# Patient Record
Sex: Female | Born: 1970
Health system: Southern US, Community
[De-identification: ages and names within clinical notes are randomized; demographics above are authoritative.]

## PROBLEM LIST (undated history)

## (undated) DIAGNOSIS — Z9889 Other specified postprocedural states: Secondary | ICD-10-CM

## (undated) DIAGNOSIS — C50411 Malignant neoplasm of upper-outer quadrant of right female breast: Principal | ICD-10-CM

## (undated) DIAGNOSIS — Z923 Personal history of irradiation: Secondary | ICD-10-CM

## (undated) DIAGNOSIS — IMO0002 Reserved for concepts with insufficient information to code with codable children: Secondary | ICD-10-CM

## (undated) DIAGNOSIS — C50919 Malignant neoplasm of unspecified site of unspecified female breast: Secondary | ICD-10-CM

## (undated) DIAGNOSIS — M1990 Unspecified osteoarthritis, unspecified site: Secondary | ICD-10-CM

## (undated) HISTORY — PX: DILATION AND CURETTAGE OF UTERUS: SHX78

## (undated) HISTORY — DX: Malignant neoplasm of upper-outer quadrant of right female breast: C50.411

## (undated) HISTORY — PX: DIAGNOSTIC LAPAROSCOPY: SUR761

## (undated) HISTORY — PX: WISDOM TOOTH EXTRACTION: SHX21

## (undated) HISTORY — DX: Other specified postprocedural states: Z98.890

---

## 2009-09-11 ENCOUNTER — Encounter: Admission: RE | Admit: 2009-09-11 | Discharge: 2009-09-11 | Payer: Self-pay | Admitting: Obstetrics and Gynecology

## 2009-09-15 ENCOUNTER — Encounter: Admission: RE | Admit: 2009-09-15 | Discharge: 2009-09-15 | Payer: Self-pay | Admitting: Obstetrics and Gynecology

## 2010-03-25 ENCOUNTER — Encounter: Payer: Self-pay | Admitting: Obstetrics and Gynecology

## 2010-08-22 ENCOUNTER — Other Ambulatory Visit: Payer: Self-pay | Admitting: Obstetrics and Gynecology

## 2010-08-22 DIAGNOSIS — Z1231 Encounter for screening mammogram for malignant neoplasm of breast: Secondary | ICD-10-CM

## 2010-09-13 ENCOUNTER — Ambulatory Visit
Admission: RE | Admit: 2010-09-13 | Discharge: 2010-09-13 | Disposition: A | Discharge status: Home / Self Care | Payer: BC Managed Care – PPO | Source: Ambulatory Visit | Attending: Obstetrics and Gynecology | Admitting: Obstetrics and Gynecology

## 2010-09-13 DIAGNOSIS — Z1231 Encounter for screening mammogram for malignant neoplasm of breast: Secondary | ICD-10-CM

## 2010-10-24 NOTE — H&P (Signed)
[name], [name]            ACCOUNT NO.:  [id number]  MEDICAL RECORD NO.:  [id number]  LOCATION:  [facility]                         FACILITY:  [facility]  PHYSICIAN:  Lenoard Aden, M.D.DATE OF BIRTH:  1971/02/13  DATE OF ADMISSION:  10/12/2010 DATE OF DISCHARGE:                             HISTORY & PHYSICAL   CHIEF COMPLAINT:  Dysfunctional uterine bleeding with structural lesion.  HISTORY OF PRESENT ILLNESS:  Brooke Ferguson is a 40 year old female G0, P0 with irregular bleeding and sonohysterogram.  The patient has had multiple endometrial polyps for surgical treatment.  She takes no medications.  There is no known drug allergy.  There is family history of lung cancer, MI, and hypertension.  She has __________pregnancy history.  PAST SURGICAL HISTORY:  Remarkable for ruptured disk with laparoscopic removal in 1992.  PHYSICAL EXAMINATION:  GENERAL:  Well-developed, well-nourished white female in no acute distress. HEENT:  Normal. NECK:  Supple.  Full range of motion. LUNGS:  Clear. HEART:  Regular rate and rhythm. ABDOMEN:  Soft and nontender. Small sized uterus and no adnexal masses. EXTREMITIES.  IMPRESSION:  Dysfunctional uterine bleeding with structural lesion for surgical therapy.  PLAN:  Proceed with diagnostic __________ resection.  Risks of anesthesia, infection, bleeding __________bowel and bladder noted. Inability to cure all dysfunctional bleeding discussed.  Patient acknowledged and wishes to proceed.     Lenoard Aden, M.D.     RJT/MEDQ  D:  10/24/2010  T:  10/24/2010  Job:  [id number]

## 2010-10-25 ENCOUNTER — Ambulatory Visit (HOSPITAL_COMMUNITY): Payer: BC Managed Care – PPO | Admitting: Anesthesiology

## 2010-10-25 ENCOUNTER — Encounter (HOSPITAL_COMMUNITY): Payer: Self-pay | Admitting: Anesthesiology

## 2010-10-25 ENCOUNTER — Ambulatory Visit (HOSPITAL_COMMUNITY)
Admission: RE | Admit: 2010-10-25 | Discharge: 2010-10-25 | Disposition: A | Discharge status: Home / Self Care | Payer: BC Managed Care – PPO | Source: Ambulatory Visit | Attending: Obstetrics and Gynecology | Admitting: Obstetrics and Gynecology

## 2010-10-25 ENCOUNTER — Other Ambulatory Visit: Payer: Self-pay | Admitting: Obstetrics and Gynecology

## 2010-10-25 ENCOUNTER — Encounter (HOSPITAL_COMMUNITY)
Admission: RE | Disposition: A | Discharge status: Home / Self Care | Payer: Self-pay | Source: Ambulatory Visit | Attending: Obstetrics and Gynecology

## 2010-10-25 DIAGNOSIS — N840 Polyp of corpus uteri: Secondary | ICD-10-CM | POA: Insufficient documentation

## 2010-10-25 DIAGNOSIS — N938 Other specified abnormal uterine and vaginal bleeding: Secondary | ICD-10-CM | POA: Insufficient documentation

## 2010-10-25 DIAGNOSIS — N949 Unspecified condition associated with female genital organs and menstrual cycle: Secondary | ICD-10-CM | POA: Insufficient documentation

## 2010-10-25 HISTORY — PX: HYSTEROSCOPY WITH RESECTOSCOPE: SHX5395

## 2010-10-25 LAB — CBC: MCHC: 34.1 g/dL (ref 30.0–36.0)

## 2010-10-25 SURGERY — HYSTEROSCOPY, USING RESECTOSCOPE
Anesthesia: Choice | Site: Uterus | Wound class: Clean Contaminated

## 2010-10-25 MED ORDER — LACTATED RINGERS IV SOLN
INTRAVENOUS | Status: DC
  Administered 2010-10-25 (×2): via INTRAVENOUS

## 2010-10-25 MED ORDER — KETOROLAC TROMETHAMINE 30 MG/ML IJ SOLN
INTRAMUSCULAR | Status: DC | PRN
  Administered 2010-10-25: 30 mg via INTRAVENOUS

## 2010-10-25 MED ORDER — FENTANYL CITRATE 0.05 MG/ML IJ SOLN
25.0000 ug | INTRAMUSCULAR | Status: DC | PRN
  Administered 2010-10-25 (×2): 50 ug via INTRAVENOUS

## 2010-10-25 MED ORDER — PROPOFOL 10 MG/ML IV EMUL
INTRAVENOUS | Status: DC | PRN
  Administered 2010-10-25: 170 mg via INTRAVENOUS

## 2010-10-25 MED ORDER — MIDAZOLAM HCL 5 MG/5ML IJ SOLN
INTRAMUSCULAR | Status: DC | PRN
  Administered 2010-10-25: 2 mg via INTRAVENOUS

## 2010-10-25 MED ORDER — ONDANSETRON HCL 4 MG/2ML IJ SOLN
INTRAMUSCULAR | Status: DC | PRN
  Administered 2010-10-25: 4 mg via INTRAVENOUS

## 2010-10-25 MED ORDER — FENTANYL CITRATE 0.05 MG/ML IJ SOLN
INTRAMUSCULAR | Status: AC
  Administered 2010-10-25: 50 ug via INTRAVENOUS
  Filled 2010-10-25: qty 2

## 2010-10-25 MED ORDER — LIDOCAINE HCL (CARDIAC) 20 MG/ML IV SOLN
INTRAVENOUS | Status: DC | PRN
  Administered 2010-10-25 (×2): 30 mg via INTRAVENOUS

## 2010-10-25 MED ORDER — FENTANYL CITRATE 0.05 MG/ML IJ SOLN
INTRAMUSCULAR | Status: AC
  Filled 2010-10-25: qty 2

## 2010-10-25 MED ORDER — ONDANSETRON HCL 4 MG/2ML IJ SOLN
INTRAMUSCULAR | Status: AC
  Filled 2010-10-25: qty 2

## 2010-10-25 MED ORDER — GLYCINE 1.5 % IR SOLN
Status: DC | PRN
  Administered 2010-10-25: 3000 mL

## 2010-10-25 MED ORDER — BUPIVACAINE HCL 0.25 % IJ SOLN
INTRAMUSCULAR | Status: DC | PRN
  Administered 2010-10-25: 20 mL

## 2010-10-25 MED ORDER — LIDOCAINE HCL (CARDIAC) 20 MG/ML IV SOLN
INTRAVENOUS | Status: AC
  Filled 2010-10-25: qty 5

## 2010-10-25 MED ORDER — VASOPRESSIN 20 UNIT/ML IJ SOLN
INTRAMUSCULAR | Status: DC | PRN
  Administered 2010-10-25: 20 [IU] via SUBCUTANEOUS

## 2010-10-25 MED ORDER — PROMETHAZINE HCL 25 MG RE SUPP
RECTAL | Status: AC
  Administered 2010-10-25: 25 mg
  Filled 2010-10-25: qty 1

## 2010-10-25 MED ORDER — MIDAZOLAM HCL 2 MG/2ML IJ SOLN
INTRAMUSCULAR | Status: AC
  Filled 2010-10-25: qty 2

## 2010-10-25 MED ORDER — FENTANYL CITRATE 0.05 MG/ML IJ SOLN
INTRAMUSCULAR | Status: DC | PRN
  Administered 2010-10-25 (×2): 50 ug via INTRAVENOUS

## 2010-10-25 MED ORDER — TRAMADOL HCL 50 MG PO TABS: 50.0000 mg | ORAL_TABLET | Freq: Four times a day (QID) | ORAL | Status: AC | PRN

## 2010-10-25 MED ORDER — PROPOFOL 10 MG/ML IV EMUL
INTRAVENOUS | Status: AC
  Filled 2010-10-25: qty 20

## 2010-10-25 SURGICAL SUPPLY — 18 items
ABLATOR ENDOMETRIAL BIPOLAR (ABLATOR) IMPLANT
CANISTER SUCTION 2500CC (MISCELLANEOUS) ×2 IMPLANT
CATH ROBINSON RED A/P 16FR (CATHETERS) ×2 IMPLANT
CLOTH BEACON ORANGE TIMEOUT ST (SAFETY) ×2 IMPLANT
CONTAINER PREFILL 10% NBF 60ML (FORM) ×4 IMPLANT
DRAPE UTILITY XL STRL (DRAPES) IMPLANT
ELECT REM PT RETURN 9FT ADLT (ELECTROSURGICAL) ×2
ELECTRODE REM PT RTRN 9FT ADLT (ELECTROSURGICAL) ×1 IMPLANT
ELECTRODE ROLLER BARREL 22FR (ELECTROSURGICAL) IMPLANT
ELECTRODE VAPORCUT 22FR (ELECTROSURGICAL) IMPLANT
GLOVE BIO SURGEON STRL SZ7.5 (GLOVE) ×4 IMPLANT
GOWN PREVENTION PLUS LG XLONG (DISPOSABLE) ×2 IMPLANT
GOWN PREVENTION PLUS XLARGE (GOWN DISPOSABLE) ×2 IMPLANT
LOOP ANGLED CUTTING 22FR (CUTTING LOOP) ×2 IMPLANT
PACK HYSTEROSCOPY LF (CUSTOM PROCEDURE TRAY) ×2 IMPLANT
SYR TB 1ML 25GX5/8 (SYRINGE) ×2 IMPLANT
TOWEL OR 17X24 6PK STRL BLUE (TOWEL DISPOSABLE) ×4 IMPLANT
WATER STERILE IRR 1000ML POUR (IV SOLUTION) ×2 IMPLANT

## 2010-10-25 NOTE — Transfer of Care (Signed)
  Anesthesia Post-op Note  Patient: Brooke Ferguson  Procedure(s) Performed:  HYSTEROSCOPY WITH RESECTOSCOPE - Polypectomy  Patient Location: PACU  Anesthesia Type: General  Level of Consciousness: awake, alert , oriented and patient cooperative  Airway and Oxygen Therapy: Patient Spontanous Breathing and Patient connected to nasal cannula oxygen  Post-op Pain: moderate  Post-op Assessment: Post-op Vital signs reviewed, Patient's Cardiovascular Status Stable and Respiratory Function Stable  Post-op Vital Signs: Reviewed and stable  Complications: No apparent anesthesia complications

## 2010-10-25 NOTE — Anesthesia Postprocedure Evaluation (Signed)
Anesthesia Post Note  Patient: Brooke Ferguson  Procedure(s) Performed:  HYSTEROSCOPY WITH RESECTOSCOPE - Polypectomy  Anesthesia type: GA  Patient location: PACU  Post pain: Pain level controlled  Post assessment: Post-op Vital signs reviewed  Last Vitals:  Filed Vitals:   10/25/10 1315  BP: 110/77  Pulse: 53  Temp:   Resp: 16    Post vital signs: Reviewed  Level of consciousness: sedated  Complications: No apparent anesthesia complications

## 2010-10-25 NOTE — Op Note (Signed)
NAMEROSEANA, RHINE            ACCOUNT NO.:  [id number]  MEDICAL RECORD NO.:  [id number]  LOCATION:  [facility]                          FACILITY:  [facility]  PHYSICIAN:  Lenoard Aden, M.D.DATE OF BIRTH:  May 08, 1970  DATE OF PROCEDURE:  10/25/2010 DATE OF DISCHARGE:  10/25/2010                              OPERATIVE REPORT   After being apprised of the risks of anesthesia, infection, bleeding, possible uterine perforation with injury to abdominal organs, bowel, bladder, and possible need for repair, the patient consented and signed. She was brought to the operating room where she was administered general anesthetic without complications, prepped and draped in the usual sterile fashion.  Catheterized until the bladder was empty.  Exam under anesthesia revealed an anteflexed uterus and no adnexal masses.  A dilute Pitressin solution was placed at 3 and 9 o'clock at the cervicovaginal junction 16 mL total, dilute paracervical block using 20 mL of the dilute Marcaine solution was placed without difficulty. Anterior lip of the cervix was grasped using single tooth tenaculum, and the cervix was dilated easily up to #29 [facility] dilator.  Hysteroscope was placed.  Visualization reveals a large endometrial polyp in the anterior and posterior wall.  These were resected in total using a right-angle loop without difficulty.  Bilateral normal tubal ostia were noted. Polyp forceps were placed to remove the remaining specimen.  D and C was performed in a 4-quadrant method using blunt curettage revealing no further evidence of polyps and an otherwise normal endometrial cavity. No fibroids were noted.  Normal ostia as previously mentioned are noted. At this time, the procedure was terminated.  Fluid deficit of 125 mL noted.  Estimated blood loss of less than 50 mL.  The patient was awakened and transferred to recovery in good condition.     Lenoard Aden, M.D.     RJT/MEDQ  D:  10/25/2010  T:   10/25/2010  Job:  [id number]

## 2010-10-25 NOTE — Progress Notes (Signed)
  No changes noted. History and physical done.

## 2010-10-25 NOTE — Anesthesia Procedure Notes (Addendum)
Performed by: Suella Grove   Procedure Name: LMA Insertion Date/Time: 10/25/2010 12:04 PM Performed by: Suella Grove Pre-anesthesia Checklist: Patient identified, Patient being monitored, Timeout performed, Emergency Drugs available and Suction available Patient Re-evaluated:Patient Re-evaluated prior to inductionOxygen Delivery Method: Circle System Utilized Preoxygenation: Pre-oxygenation with 100% oxygen Intubation Type: IV induction Ventilation: Mask ventilation without difficulty LMA: LMA inserted LMA Size: 4.0 Grade View: Grade II Number of attempts: 1 Dental Injury: Teeth and Oropharynx as per pre-operative assessment

## 2010-10-25 NOTE — Op Note (Signed)
10/25/2010  12:38 PM  PATIENT:  Brooke Ferguson  40 y.o. female  PRE-OPERATIVE DIAGNOSIS:  Menomenorrhagia  POST-OPERATIVE DIAGNOSIS:Same plus multiple endometrial polyps  PROCEDURE:  Procedure(s): HYSTEROSCOPY WITH RESECTOSCOPIC POLYPECTOMY and D&C  SURGEON:  Surgeon(s): Lenoard Aden, MD  PHYSICIAN ASSISTANT:   ASSISTANTS: none   ANESTHESIA:   local and general  ESTIMATED BLOOD LOSS: * No blood loss amount entered *   BLOOD ADMINISTERED:none  DRAINS: none   LOCAL MEDICATIONS USED:  MARCAINE 20CC and OTHER Pitressin 16cc  SPECIMEN:  Source of Specimen:  EMC and endometrial polyps  DISPOSITION OF SPECIMEN:  PATHOLOGY  COUNTS:  YES  TOURNIQUET:  * No tourniquets in log *  DICTATION #: [id number]  PLAN OF CARE: DC home  PATIENT DISPOSITION:  PACU - hemodynamically stable.

## 2010-10-25 NOTE — Anesthesia Preprocedure Evaluation (Addendum)
Anesthesia Evaluation  Name, MR# and DOB Patient awake  General Assessment Comment  Reviewed: Allergy & Precautions, H&P , NPO status , Patient's Chart, lab work & pertinent test results, reviewed documented beta blocker date and time   Airway Mallampati: I TM Distance: >3 FB Neck ROM: full    Dental  (+) Teeth Intact   Pulmonary  clear to auscultation  breath sounds clear to auscultation none    Cardiovascular Exercise Tolerance: Good regular Normal    Neuro/Psych Negative Neurological ROS  Negative Psych ROS  GI/Hepatic/Renal negative GI ROS  negative Liver ROS  negative Renal ROS        Endo/Other  Negative Endocrine ROS (+)      Abdominal   Musculoskeletal negative musculoskeletal ROS (+)   Hematology negative hematology ROS (+)   Peds  Reproductive/Obstetrics negative OB ROS    Anesthesia Other Findings            Anesthesia Physical Anesthesia Plan  ASA: I  Anesthesia Plan: General   Post-op Pain Management:    Induction:   Airway Management Planned: LMA  Additional Equipment:   Intra-op Plan:   Post-operative Plan:   Informed Consent: I have reviewed the patients History and Physical, chart, labs and discussed the procedure including the risks, benefits and alternatives for the proposed anesthesia with the patient or authorized representative who has indicated his/her understanding and acceptance.   Dental Advisory Given  Plan Discussed with: Surgeon and CRNA  Anesthesia Plan Comments:        Anesthesia Quick Evaluation

## 2010-11-13 ENCOUNTER — Encounter (HOSPITAL_COMMUNITY): Payer: Self-pay | Admitting: Obstetrics and Gynecology

## 2011-03-18 ENCOUNTER — Encounter (HOSPITAL_BASED_OUTPATIENT_CLINIC_OR_DEPARTMENT_OTHER): Payer: Self-pay | Admitting: *Deleted

## 2011-03-18 ENCOUNTER — Emergency Department (HOSPITAL_BASED_OUTPATIENT_CLINIC_OR_DEPARTMENT_OTHER)
Admission: EM | Admit: 2011-03-18 | Discharge: 2011-03-18 | Disposition: A | Discharge status: Home / Self Care | Payer: BC Managed Care – PPO | Attending: Emergency Medicine | Admitting: Emergency Medicine

## 2011-03-18 ENCOUNTER — Emergency Department (INDEPENDENT_AMBULATORY_CARE_PROVIDER_SITE_OTHER): Payer: BC Managed Care – PPO

## 2011-03-18 DIAGNOSIS — R079 Chest pain, unspecified: Secondary | ICD-10-CM

## 2011-03-18 DIAGNOSIS — M542 Cervicalgia: Secondary | ICD-10-CM | POA: Insufficient documentation

## 2011-03-18 DIAGNOSIS — M79609 Pain in unspecified limb: Secondary | ICD-10-CM

## 2011-03-18 DIAGNOSIS — M25519 Pain in unspecified shoulder: Secondary | ICD-10-CM

## 2011-03-18 DIAGNOSIS — M5412 Radiculopathy, cervical region: Secondary | ICD-10-CM | POA: Insufficient documentation

## 2011-03-18 HISTORY — DX: Reserved for concepts with insufficient information to code with codable children: IMO0002

## 2011-03-18 LAB — COMPREHENSIVE METABOLIC PANEL
CO2: 25 mEq/L (ref 19–32)
Calcium: 9.2 mg/dL (ref 8.4–10.5)
Creatinine, Ser: 0.7 mg/dL (ref 0.50–1.10)
GFR calc Af Amer: >90 mL/min (ref >90)
GFR calc non Af Amer: >90 mL/min (ref >90)
Glucose, Bld: 91 mg/dL (ref 70–99)
Sodium: 141 mEq/L (ref 135–145)
Total Protein: 6.5 g/dL (ref 6.0–8.3)

## 2011-03-18 LAB — URINALYSIS, ROUTINE W REFLEX MICROSCOPIC
Leukocytes, UA: NEGATIVE
Nitrite: NEGATIVE
Protein, ur: NEGATIVE mg/dL
Specific Gravity, Urine: 1.026 (ref 1.005–1.030)
Urobilinogen, UA: 0.2 mg/dL (ref 0.0–1.0)

## 2011-03-18 LAB — DIFFERENTIAL
Eosinophils Absolute: 0.1 10*3/uL (ref 0.0–0.7)
Eosinophils Relative: 1 % (ref 0–5)
Lymphocytes Relative: 37 % (ref 12–46)
Lymphs Abs: 2 10*3/uL (ref 0.7–4.0)
Monocytes Absolute: 0.4 10*3/uL (ref 0.1–1.0)

## 2011-03-18 LAB — CBC
HCT: 38.3 % (ref 36.0–46.0)
MCH: 32.7 pg (ref 26.0–34.0)
MCV: 94.8 fL (ref 78.0–100.0)
Platelets: 326 10*3/uL (ref 150–400)
RDW: 11.6 % (ref 11.5–15.5)
WBC: 5.3 10*3/uL (ref 4.0–10.5)

## 2011-03-18 LAB — D-DIMER, QUANTITATIVE: D-Dimer, Quant: 0.35 ug/mL-FEU (ref 0.00–0.48)

## 2011-03-18 MED ORDER — PREDNISONE 10 MG PO TABS: 20.0000 mg | ORAL_TABLET | Freq: Every day | ORAL | Status: DC

## 2011-03-18 MED ORDER — HYDROMORPHONE HCL PF 2 MG/ML IJ SOLN
2.0000 mg | Freq: Once | INTRAMUSCULAR | Status: AC
  Administered 2011-03-18: 2 mg via INTRAVENOUS
  Filled 2011-03-18: qty 1

## 2011-03-18 MED ORDER — OXYCODONE-ACETAMINOPHEN 5-325 MG PO TABS: 1.0000 | ORAL_TABLET | ORAL | Status: DC | PRN

## 2011-03-18 MED ORDER — PREDNISONE 50 MG PO TABS
60.0000 mg | ORAL_TABLET | Freq: Once | ORAL | Status: AC
  Administered 2011-03-18: 60 mg via ORAL
  Filled 2011-03-18: qty 1

## 2011-03-18 NOTE — ED Provider Notes (Signed)
History     CSN: [id number]  Arrival date & time 03/18/11  6213   First MD Initiated Contact with Patient 03/18/11 1025      Chief Complaint  Patient presents with  . Neck Pain    (Consider location/radiation/quality/duration/timing/severity/associated sxs/prior treatment) HPI Patient with mri for disc at c5c6 s/p cortisone injection neurosurgeon Dr. Jeral Fruit.  Pain was improved until 6 p.m. Yesterday when left neck felt tight to shoulder c.w. Prior pain and radiates to left arm.  Arm feels numb, weakness in left arm at baseline per patient.  Patient is right handed.  Patient saw chiropractor this a.m. And told it was too inflamed and to follow up with her neurosurgeon.  Neurosurgery unable to see and sent here for follow up.   Past Medical History  Diagnosis Date  . Bulging disc     Past Surgical History  Procedure Date  . Hysteroscopy with resectoscope 10/25/2010    Procedure: HYSTEROSCOPY WITH RESECTOSCOPE;  Surgeon: Lenoard Aden, MD;  Location: [facility];  Service: Gynecology;  Laterality: N/A;  Polypectomy    History reviewed. No pertinent family history.  History  Substance Use Topics  . Smoking status: Never Smoker   . Smokeless tobacco: Never Used  . Alcohol Use: Yes     occ    OB History    Grav Para Term Preterm Abortions TAB SAB Ect Mult Living                  Review of Systems  All other systems reviewed and are negative.    Allergies  Vicodin  Home Medications  No current outpatient prescriptions on file.  BP 132/82  Pulse 74  Temp(Src) 98.4 F (36.9 C) (Oral)  Resp 20  SpO2 99%  LMP 03/10/2011  Physical Exam  Nursing note and vitals reviewed. Constitutional: She is oriented to person, place, and time. She appears well-developed and well-nourished.  HENT:  Head: Normocephalic and atraumatic.  Eyes: Conjunctivae and EOM are normal. Pupils are equal, round, and reactive to light.  Neck: Normal range of motion. Neck supple.    Cardiovascular: Normal rate, regular rhythm and normal heart sounds.   Pulmonary/Chest: Effort normal and breath sounds normal.  Abdominal: Soft. Bowel sounds are normal.  Musculoskeletal: Normal range of motion.  Neurological: She is alert and oriented to person, place, and time. She has normal strength and normal reflexes. No sensory deficit.  Skin: Skin is warm and dry.  Psychiatric: She has a normal mood and affect.    ED Course  Procedures (including critical care time)  Labs Reviewed - No data to display No results found. Results for orders placed during the hospital encounter of 03/18/11  CBC      Component Value Range   WBC 5.3  4.0 - 10.5 (K/uL)   RBC 4.04  3.87 - 5.11 (MIL/uL)   Hemoglobin 13.2  12.0 - 15.0 (g/dL)   HCT 08.6  57.8 - 46.9 (%)   MCV 94.8  78.0 - 100.0 (fL)   MCH 32.7  26.0 - 34.0 (pg)   MCHC 34.5  30.0 - 36.0 (g/dL)   RDW 62.9  52.8 - 41.3 (%)   Platelets 326  150 - 400 (K/uL)  DIFFERENTIAL      Component Value Range   Neutrophils Relative 54  43 - 77 (%)   Neutro Abs 2.9  1.7 - 7.7 (K/uL)   Lymphocytes Relative 37  12 - 46 (%)   Lymphs Abs 2.0  0.7 - 4.0 (K/uL)   Monocytes Relative 8  3 - 12 (%)   Monocytes Absolute 0.4  0.1 - 1.0 (K/uL)   Eosinophils Relative 1  0 - 5 (%)   Eosinophils Absolute 0.1  0.0 - 0.7 (K/uL)   Basophils Relative 0  0 - 1 (%)   Basophils Absolute 0.0  0.0 - 0.1 (K/uL)  COMPREHENSIVE METABOLIC PANEL      Component Value Range   Sodium 141  135 - 145 (mEq/L)   Potassium 3.7  3.5 - 5.1 (mEq/L)   Chloride 107  96 - 112 (mEq/L)   CO2 25  19 - 32 (mEq/L)   Glucose, Bld 91  70 - 99 (mg/dL)   BUN 14  6 - 23 (mg/dL)   Creatinine, Ser 1.61  0.50 - 1.10 (mg/dL)   Calcium 9.2  8.4 - 09.6 (mg/dL)   Total Protein 6.5  6.0 - 8.3 (g/dL)   Albumin 3.5  3.5 - 5.2 (g/dL)   AST 13  0 - 37 (U/L)   ALT 11  0 - 35 (U/L)   Alkaline Phosphatase 42  39 - 117 (U/L)   Total Bilirubin 0.3  0.3 - 1.2 (mg/dL)   GFR calc non Af Amer >90  >90  (mL/min)   GFR calc Af Amer >90  >90 (mL/min)  D-DIMER, QUANTITATIVE      Component Value Range   D-Dimer, Quant 0.35  0.00 - 0.48 (ug/mL-FEU)   Dg Chest 2 View  03/18/2011  *RADIOLOGY REPORT*  Clinical Data: Left lateral neck, shoulder and arm pain, left upper anterior chest pain  CHEST - 2 VIEW  Comparison: None  Findings: Normal heart size, mediastinal contours, and pulmonary vascularity. Minimal peribronchial thickening without infiltrate or effusion. No pneumothorax. No acute osseous findings.  IMPRESSION: Minimal bronchitic changes.  Original Report Authenticated By: Lollie Marrow, M.D.    No diagnosis found.    MDM  Patient with decreased pain after prednisone and 2 mg of Dilaudid IM. No evidence of pneumothorax or infiltrate on chest x-Afnan [place]. D-dimer is normal. The pain appears to come from the neck without any acute neurological changes. She will be placed on prednisone and Percocet. She'll follow with Dr. care.      Hilario Quarry, MD 03/18/11 [phone]

## 2011-03-18 NOTE — ED Notes (Signed)
Pt states she has known bulging disc has had 2 cortisone injections in neck for same issue in recent past at [facility]. Pt went to her chiropractor who states left side of neck and muscles going to left shoulder were too inflamed for him to touch.She was told to see her neurologist/neurosurgeon. She went there was told wouldn't see her so she came here.

## 2011-03-19 ENCOUNTER — Encounter (HOSPITAL_COMMUNITY): Payer: Self-pay | Admitting: *Deleted

## 2011-03-26 ENCOUNTER — Encounter (HOSPITAL_COMMUNITY): Payer: Self-pay | Admitting: Pharmacist

## 2011-04-02 ENCOUNTER — Other Ambulatory Visit: Payer: Self-pay | Admitting: Obstetrics and Gynecology

## 2011-04-10 ENCOUNTER — Encounter (HOSPITAL_COMMUNITY)
Admission: RE | Disposition: A | Discharge status: Home / Self Care | Payer: Self-pay | Source: Ambulatory Visit | Attending: Obstetrics and Gynecology

## 2011-04-10 ENCOUNTER — Encounter (HOSPITAL_COMMUNITY): Payer: Self-pay | Admitting: Anesthesiology

## 2011-04-10 ENCOUNTER — Ambulatory Visit (HOSPITAL_COMMUNITY)
Admission: RE | Admit: 2011-04-10 | Discharge: 2011-04-10 | Disposition: A | Discharge status: Home / Self Care | Payer: BC Managed Care – PPO | Source: Ambulatory Visit | Attending: Obstetrics and Gynecology | Admitting: Obstetrics and Gynecology

## 2011-04-10 ENCOUNTER — Other Ambulatory Visit: Payer: Self-pay | Admitting: Obstetrics and Gynecology

## 2011-04-10 ENCOUNTER — Encounter (HOSPITAL_COMMUNITY): Payer: Self-pay | Admitting: *Deleted

## 2011-04-10 ENCOUNTER — Ambulatory Visit (HOSPITAL_COMMUNITY): Payer: BC Managed Care – PPO | Admitting: Anesthesiology

## 2011-04-10 DIAGNOSIS — N920 Excessive and frequent menstruation with regular cycle: Secondary | ICD-10-CM | POA: Insufficient documentation

## 2011-04-10 HISTORY — DX: Unspecified osteoarthritis, unspecified site: M19.90

## 2011-04-10 LAB — HCG, SERUM, QUALITATIVE: Preg, Serum: NEGATIVE

## 2011-04-10 LAB — CBC
MCV: 95.2 fL (ref 78.0–100.0)
Platelets: 318 10*3/uL (ref 150–400)
RBC: 4.33 MIL/uL (ref 3.87–5.11)
RDW: 12 % (ref 11.5–15.5)
WBC: 4.3 10*3/uL (ref 4.0–10.5)

## 2011-04-10 SURGERY — DILATATION & CURETTAGE/HYSTEROSCOPY WITH THERMACHOICE ABLATION
Anesthesia: General | Site: Uterus | Wound class: Clean Contaminated

## 2011-04-10 MED ORDER — OXYCODONE-ACETAMINOPHEN 5-325 MG PO TABS: 1.0000 | ORAL_TABLET | ORAL | Status: AC | PRN

## 2011-04-10 MED ORDER — PROMETHAZINE HCL 25 MG/ML IJ SOLN: 6.2500 mg | INTRAMUSCULAR | Status: DC | PRN

## 2011-04-10 MED ORDER — MIDAZOLAM HCL 2 MG/2ML IJ SOLN
INTRAMUSCULAR | Status: AC
  Filled 2011-04-10: qty 2

## 2011-04-10 MED ORDER — OXYCODONE-ACETAMINOPHEN 5-325 MG PO TABS
1.0000 | ORAL_TABLET | ORAL | Status: DC | PRN
  Administered 2011-04-10: 1 via ORAL

## 2011-04-10 MED ORDER — LACTATED RINGERS IV SOLN
INTRAVENOUS | Status: DC
  Administered 2011-04-10 (×3): via INTRAVENOUS

## 2011-04-10 MED ORDER — LIDOCAINE HCL (CARDIAC) 20 MG/ML IV SOLN
INTRAVENOUS | Status: DC | PRN
  Administered 2011-04-10: 80 mg via INTRAVENOUS

## 2011-04-10 MED ORDER — OXYCODONE-ACETAMINOPHEN 5-325 MG PO TABS
ORAL_TABLET | ORAL | Status: AC
  Administered 2011-04-10: 1 via ORAL
  Filled 2011-04-10: qty 1

## 2011-04-10 MED ORDER — DEXAMETHASONE SODIUM PHOSPHATE 4 MG/ML IJ SOLN
INTRAMUSCULAR | Status: DC | PRN
  Administered 2011-04-10: 10 mg via INTRAVENOUS

## 2011-04-10 MED ORDER — MEPERIDINE HCL 25 MG/ML IJ SOLN
6.2500 mg | INTRAMUSCULAR | Status: DC | PRN
  Administered 2011-04-10 (×2): 12.5 mg via INTRAVENOUS

## 2011-04-10 MED ORDER — MEPERIDINE HCL 25 MG/ML IJ SOLN
INTRAMUSCULAR | Status: AC
  Administered 2011-04-10: 12.5 mg via INTRAVENOUS
  Filled 2011-04-10: qty 1

## 2011-04-10 MED ORDER — CEFAZOLIN SODIUM 1-5 GM-% IV SOLN
INTRAVENOUS | Status: AC
  Administered 2011-04-10: 1 g via INTRAVENOUS
  Filled 2011-04-10: qty 50

## 2011-04-10 MED ORDER — LACTATED RINGERS IR SOLN
Status: DC | PRN
  Administered 2011-04-10: 1

## 2011-04-10 MED ORDER — BUPIVACAINE HCL (PF) 0.25 % IJ SOLN
INTRAMUSCULAR | Status: AC
  Filled 2011-04-10: qty 30

## 2011-04-10 MED ORDER — FENTANYL CITRATE 0.05 MG/ML IJ SOLN
INTRAMUSCULAR | Status: AC
  Administered 2011-04-10: 25 ug via INTRAVENOUS
  Filled 2011-04-10: qty 2

## 2011-04-10 MED ORDER — PROPOFOL 10 MG/ML IV EMUL
INTRAVENOUS | Status: DC | PRN
  Administered 2011-04-10: 150 mg via INTRAVENOUS

## 2011-04-10 MED ORDER — BUPIVACAINE HCL (PF) 0.25 % IJ SOLN
INTRAMUSCULAR | Status: DC | PRN
  Administered 2011-04-10: 20 mL

## 2011-04-10 MED ORDER — FENTANYL CITRATE 0.05 MG/ML IJ SOLN
INTRAMUSCULAR | Status: DC | PRN
  Administered 2011-04-10: 50 ug via INTRAVENOUS
  Administered 2011-04-10: 25 ug via INTRAVENOUS
  Administered 2011-04-10 (×2): 50 ug via INTRAVENOUS
  Administered 2011-04-10: 25 ug via INTRAVENOUS

## 2011-04-10 MED ORDER — FENTANYL CITRATE 0.05 MG/ML IJ SOLN
INTRAMUSCULAR | Status: AC
  Filled 2011-04-10: qty 4

## 2011-04-10 MED ORDER — DEXAMETHASONE SODIUM PHOSPHATE 10 MG/ML IJ SOLN
INTRAMUSCULAR | Status: AC
  Filled 2011-04-10: qty 1

## 2011-04-10 MED ORDER — KETOROLAC TROMETHAMINE 30 MG/ML IJ SOLN
INTRAMUSCULAR | Status: AC
  Administered 2011-04-10: 30 mg via INTRAVENOUS
  Filled 2011-04-10: qty 1

## 2011-04-10 MED ORDER — MIDAZOLAM HCL 5 MG/5ML IJ SOLN
INTRAMUSCULAR | Status: DC | PRN
  Administered 2011-04-10: 2 mg via INTRAVENOUS

## 2011-04-10 MED ORDER — FENTANYL CITRATE 0.05 MG/ML IJ SOLN
25.0000 ug | INTRAMUSCULAR | Status: DC | PRN
  Administered 2011-04-10: 25 ug via INTRAVENOUS

## 2011-04-10 MED ORDER — PROPOFOL 10 MG/ML IV EMUL
INTRAVENOUS | Status: AC
  Filled 2011-04-10: qty 20

## 2011-04-10 MED ORDER — KETOROLAC TROMETHAMINE 30 MG/ML IJ SOLN
INTRAMUSCULAR | Status: AC
  Filled 2011-04-10: qty 1

## 2011-04-10 MED ORDER — KETOROLAC TROMETHAMINE 30 MG/ML IJ SOLN
15.0000 mg | Freq: Once | INTRAMUSCULAR | Status: AC | PRN
  Administered 2011-04-10: 30 mg via INTRAVENOUS

## 2011-04-10 MED ORDER — VASOPRESSIN 20 UNIT/ML IJ SOLN
INTRAMUSCULAR | Status: AC
  Filled 2011-04-10: qty 1

## 2011-04-10 MED ORDER — ONDANSETRON HCL 4 MG/2ML IJ SOLN
INTRAMUSCULAR | Status: AC
  Filled 2011-04-10: qty 2

## 2011-04-10 MED ORDER — SCOPOLAMINE 1 MG/3DAYS TD PT72
1.0000 | MEDICATED_PATCH | TRANSDERMAL | Status: DC
  Administered 2011-04-10: 1.5 mg via TRANSDERMAL

## 2011-04-10 MED ORDER — SCOPOLAMINE 1 MG/3DAYS TD PT72
MEDICATED_PATCH | TRANSDERMAL | Status: AC
  Administered 2011-04-10: 1.5 mg via TRANSDERMAL
  Filled 2011-04-10: qty 1

## 2011-04-10 MED ORDER — CEFAZOLIN SODIUM 1-5 GM-% IV SOLN: 1.0000 g | INTRAVENOUS | Status: DC

## 2011-04-10 MED ORDER — LIDOCAINE HCL (CARDIAC) 20 MG/ML IV SOLN
INTRAVENOUS | Status: AC
  Filled 2011-04-10: qty 5

## 2011-04-10 MED ORDER — ONDANSETRON HCL 4 MG/2ML IJ SOLN
INTRAMUSCULAR | Status: DC | PRN
  Administered 2011-04-10: 4 mg via INTRAVENOUS

## 2011-04-10 MED ORDER — FENTANYL CITRATE 0.05 MG/ML IJ SOLN
INTRAMUSCULAR | Status: AC
  Filled 2011-04-10: qty 5

## 2011-04-10 MED ORDER — KETOROLAC TROMETHAMINE 30 MG/ML IJ SOLN
INTRAMUSCULAR | Status: DC | PRN
  Administered 2011-04-10: 60 mg via INTRAVENOUS

## 2011-04-10 MED ORDER — IBUPROFEN 200 MG PO TABS: 200.0000 mg | ORAL_TABLET | Freq: Four times a day (QID) | ORAL | Status: DC | PRN

## 2011-04-10 SURGICAL SUPPLY — 13 items
ABLATOR ENDOMETRIAL BIPOLAR (ABLATOR) ×3 IMPLANT
CATH ROBINSON RED A/P 16FR (CATHETERS) ×3 IMPLANT
CATH THERMACHOICE III (CATHETERS) ×3 IMPLANT
CLOTH BEACON ORANGE TIMEOUT ST (SAFETY) ×3 IMPLANT
CONTAINER PREFILL 10% NBF 60ML (FORM) ×6 IMPLANT
GLOVE BIO SURGEON STRL SZ7.5 (GLOVE) ×6 IMPLANT
GOWN PREVENTION PLUS LG XLONG (DISPOSABLE) ×3 IMPLANT
GOWN STRL REIN XL XLG (GOWN DISPOSABLE) ×3 IMPLANT
PACK HYSTEROSCOPY LF (CUSTOM PROCEDURE TRAY) ×3 IMPLANT
PAD PREP 24X48 CUFFED NSTRL (MISCELLANEOUS) ×3 IMPLANT
SYR TB 1ML 25GX5/8 (SYRINGE) ×3 IMPLANT
TOWEL OR 17X24 6PK STRL BLUE (TOWEL DISPOSABLE) ×6 IMPLANT
WATER STERILE IRR 1000ML POUR (IV SOLUTION) ×3 IMPLANT

## 2011-04-10 NOTE — Preoperative (Signed)
Beta Blockers   Reason not to administer Beta Blockers:Not Applicable 

## 2011-04-10 NOTE — H&P (Signed)
Brooke Ferguson, Brooke Ferguson            ACCOUNT NO.:  [id number]  MEDICAL RECORD NO.:  [id number]  LOCATION:  [facility]                         FACILITY:  [facility]  PHYSICIAN:  Lenoard Aden, M.D.DATE OF BIRTH:  [birth year]  DATE OF ADMISSION:  03/08/2011 DATE OF DISCHARGE:                             HISTORY & PHYSICAL   CHIEF COMPLAINT:  Refractorymenorrhagia.  HISTORY OF PRESENT ILLNESS:  A 41 year old white female G0, P0who is status post  diagnostic hysteroscopy, D and C, ruptured ovarian cyst, __________ in 1992.  She has no known drug allergies.  MEDICATIONS:  Advil as needed.  FAMILY HISTORY:  Heart disease, lung cancer, and hypertension.  __________  PHYSICAL EXAMINATION:  GENERAL:  She is a well-developed, well- nourished, white female, in no acute distress. HEENT:  Normal. NECK:  Supple.  Full range of motion. LUNGS:  Clear. HEART:  Regular rate and rhythm. ABDOMEN:  Soft and nontender. PELVIC:  Reveals an anteflexed uterus and no adnexal masses. EXTREMITIES:  There are no cords.  IMPRESSION:  Refractory menorrhagia.  PLAN:  Proceed with diagnostic hysteroscopy, D and C, NovaSure endometrial ablation.  Risks of anesthesia, infection, bleeding, injury to abdominal organswere discussed,  delayed versus immediate complications to include bowel and bladder injury noted.  The patient acknowledges and wishes to proceed.     Lenoard Aden, M.D.     RJT/MEDQ  D:  04/09/2011  T:  04/09/2011  Job:  [id number]

## 2011-04-10 NOTE — Anesthesia Preprocedure Evaluation (Signed)
Anesthesia Evaluation  Patient identified by MRN, date of birth, ID band Patient awake    Reviewed: Allergy & Precautions, H&P , NPO status , Patient's Chart, lab work & pertinent test results, reviewed documented beta blocker date and time   Airway Mallampati: I TM Distance: >3 FB Neck ROM: full    Dental No notable dental hx. (+) Teeth Intact   Pulmonary neg pulmonary ROS,    Pulmonary exam normal       Cardiovascular neg cardio ROS     Neuro/Psych Negative Neurological ROS  Negative Psych ROS   GI/Hepatic negative GI ROS, Neg liver ROS,   Endo/Other  Negative Endocrine ROS  Renal/GU negative Renal ROS  Genitourinary negative   Musculoskeletal negative musculoskeletal ROS (+)   Abdominal Normal abdominal exam  (+)   Peds negative pediatric ROS (+)  Hematology negative hematology ROS (+)   Anesthesia Other Findings   Reproductive/Obstetrics negative OB ROS                           Anesthesia Physical Anesthesia Plan  ASA: I  Anesthesia Plan: General   Post-op Pain Management:    Induction: Intravenous  Airway Management Planned: LMA  Additional Equipment:   Intra-op Plan:   Post-operative Plan:   Informed Consent: I have reviewed the patients History and Physical, chart, labs and discussed the procedure including the risks, benefits and alternatives for the proposed anesthesia with the patient or authorized representative who has indicated his/her understanding and acceptance.     Plan Discussed with: CRNA  Anesthesia Plan Comments:         Anesthesia Quick Evaluation  

## 2011-04-10 NOTE — Op Note (Signed)
04/10/2011  10:20 AM  PATIENT:  Brooke Ferguson  41 y.o. female  PRE-OPERATIVE DIAGNOSIS:  Menometrorrhagia  POST-OPERATIVE DIAGNOSIS:  Menometrorrhagia  PROCEDURE:  Procedure(s): DILATATION & CURETTAGE/HYSTEROSCOPY WITH THERMACHOICE ABLATION  SURGEON:  Surgeon(s): Lenoard Aden, MD  ASSISTANTS: none   ANESTHESIA:   local and general  ESTIMATED BLOOD LOSS: * No blood loss amount entered *   DRAINS: none   LOCAL MEDICATIONS USED:  MARCAINE 20CC  SPECIMEN:  Source of Specimen:  emc  DISPOSITION OF SPECIMEN:  PATHOLOGY  COUNTS:  YES  DICTATION #: [id number]  PLAN OF CARE: dc HOME  PATIENT DISPOSITION:  PACU - hemodynamically stable.

## 2011-04-10 NOTE — Op Note (Signed)
[name], Brooke Ferguson            ACCOUNT NO.:  [id number]  MEDICAL RECORD NO.:  [id number]  LOCATION:  [facility]                          FACILITY[facility]  [facility]  PHYSICIAN:  Lenoard Aden, M.D.DATE OF BIRTH:  [birth year]  DATE OF PROCEDURE:  04/10/2011 DATE OF DISCHARGE:                              OPERATIVE REPORT   DESCRIPTION OF PROCEDURE:  After being apprised of risks of anesthesia, infection, bleeding, possible injury to abdominal organs, need for repair, delayed versus immediate complications to include bowel and bladder injury, possible need for repair, the patient was brought to the operating room.  She was administered general anesthetic without complications.  Prepped and draped in usual sterile fashion. Catheterized until the bladder was empty.  Time-out was performed.  Exam under anesthesia reveals a small anteflexed uterus and no adnexal masses.  At this time, a speculum was placed.  Dilute paracervical block with 20 mL of Marcaine was placed.  Cervix was easily dilated up to a #23 [employer] dilator.  Hysteroscope was placed.  Visualization reveals a normal endometrial cavity.  D and C is performed using sharp curettage in a 4-quadrant method.  Attempts to place the NovaSure device were unsuccessful due to a small cavity size, therefore the procedure was changed to a Thermachoice ablation.  The cavity is irrigated with a D5 solution and the Thermachoice device was primed and placed in the standard fashion with the pressure set to approximately 170 preheating and then a heating cycle of 8 minutes was performed.  The device was removed.  Reinspection hysteroscopically reveals a well-ablated endometrial cavity.  No evidence of perforation.  Good hemostasis was noted.  Fluid deficit of 5 mL.  The patient tolerated the procedure well, was awakened, and transferred to recovery in good condition.     Lenoard Aden, M.D.     RJT/MEDQ  D:  04/10/2011  T:  04/10/2011  Job:   [id number]

## 2011-04-10 NOTE — Progress Notes (Signed)
Patient ID: Brooke Ferguson, female   DOB: 06/13/1970, 41 y.o.   MRN: [id number] Patient seen and examined. Consent witnessed and signed. No changes noted. Update completed.

## 2011-04-10 NOTE — Transfer of Care (Signed)
Immediate Anesthesia Transfer of Care Note  Patient: Brooke Ferguson  Procedure(s) Performed:  DILATATION & CURETTAGE/HYSTEROSCOPY WITH NOVASURE ABLATION  Patient Location: PACU  Anesthesia Type: General  Level of Consciousness: awake, alert , oriented and patient cooperative  Airway & Oxygen Therapy: Patient Spontanous Breathing and Patient connected to nasal cannula oxygen  Post-op Assessment: Report given to PACU RN and Post -op Vital signs reviewed and stable  Post vital signs: Reviewed and stable  Complications: No apparent anesthesia complications

## 2011-04-10 NOTE — Anesthesia Postprocedure Evaluation (Signed)
Anesthesia Post Note  Patient: Brooke Ferguson  Procedure(s) Performed:  DILATATION & CURETTAGE/HYSTEROSCOPY WITH THERMACHOICE ABLATION  Anesthesia type: General  Patient location: PACU  Post pain: Pain level controlled  Post assessment: Post-op Vital signs reviewed  Last Vitals:  Filed Vitals:   04/10/11 1032  BP: 102/70  Pulse: 68  Temp: 36.7 C  Resp: 18    Post vital signs: Reviewed  Level of consciousness: sedated  Complications: No apparent anesthesia complicationsfj

## 2011-08-13 ENCOUNTER — Other Ambulatory Visit: Payer: Self-pay | Admitting: Obstetrics and Gynecology

## 2011-08-13 DIAGNOSIS — Z1231 Encounter for screening mammogram for malignant neoplasm of breast: Secondary | ICD-10-CM

## 2011-09-18 ENCOUNTER — Ambulatory Visit
Admission: RE | Admit: 2011-09-18 | Discharge: 2011-09-18 | Disposition: A | Discharge status: Home / Self Care | Payer: BC Managed Care – PPO | Source: Ambulatory Visit | Attending: Obstetrics and Gynecology | Admitting: Obstetrics and Gynecology

## 2011-09-18 DIAGNOSIS — Z1231 Encounter for screening mammogram for malignant neoplasm of breast: Secondary | ICD-10-CM

## 2012-08-12 ENCOUNTER — Other Ambulatory Visit: Payer: Self-pay

## 2012-08-12 DIAGNOSIS — Z1231 Encounter for screening mammogram for malignant neoplasm of breast: Secondary | ICD-10-CM

## 2012-09-18 ENCOUNTER — Ambulatory Visit
Admission: RE | Admit: 2012-09-18 | Discharge: 2012-09-18 | Disposition: A | Discharge status: Home / Self Care | Payer: PRIVATE HEALTH INSURANCE | Source: Ambulatory Visit

## 2012-09-18 DIAGNOSIS — Z1231 Encounter for screening mammogram for malignant neoplasm of breast: Secondary | ICD-10-CM

## 2013-08-13 ENCOUNTER — Other Ambulatory Visit: Payer: Self-pay

## 2013-08-13 DIAGNOSIS — Z1231 Encounter for screening mammogram for malignant neoplasm of breast: Secondary | ICD-10-CM

## 2013-09-14 ENCOUNTER — Other Ambulatory Visit: Payer: Self-pay

## 2013-09-30 ENCOUNTER — Ambulatory Visit
Admission: RE | Admit: 2013-09-30 | Discharge: 2013-09-30 | Disposition: A | Discharge status: Home / Self Care | Payer: PRIVATE HEALTH INSURANCE | Source: Ambulatory Visit

## 2013-09-30 ENCOUNTER — Encounter (INDEPENDENT_AMBULATORY_CARE_PROVIDER_SITE_OTHER): Payer: Self-pay

## 2013-09-30 DIAGNOSIS — Z1231 Encounter for screening mammogram for malignant neoplasm of breast: Secondary | ICD-10-CM

## 2013-10-04 ENCOUNTER — Other Ambulatory Visit: Payer: Self-pay | Admitting: Obstetrics and Gynecology

## 2013-10-04 DIAGNOSIS — R928 Other abnormal and inconclusive findings on diagnostic imaging of breast: Secondary | ICD-10-CM

## 2013-10-08 ENCOUNTER — Ambulatory Visit
Admission: RE | Admit: 2013-10-08 | Discharge: 2013-10-08 | Disposition: A | Discharge status: Home / Self Care | Payer: PRIVATE HEALTH INSURANCE | Source: Ambulatory Visit | Attending: Obstetrics and Gynecology | Admitting: Obstetrics and Gynecology

## 2013-10-08 ENCOUNTER — Other Ambulatory Visit: Payer: Self-pay | Admitting: Obstetrics and Gynecology

## 2013-10-08 DIAGNOSIS — R928 Other abnormal and inconclusive findings on diagnostic imaging of breast: Secondary | ICD-10-CM

## 2013-10-15 ENCOUNTER — Other Ambulatory Visit: Payer: Self-pay | Admitting: Obstetrics and Gynecology

## 2013-10-15 DIAGNOSIS — N6310 Unspecified lump in the right breast, unspecified quadrant: Secondary | ICD-10-CM

## 2014-03-31 ENCOUNTER — Other Ambulatory Visit: Payer: Self-pay

## 2014-04-12 ENCOUNTER — Other Ambulatory Visit: Payer: Self-pay | Admitting: Obstetrics and Gynecology

## 2014-04-12 ENCOUNTER — Ambulatory Visit
Admission: RE | Admit: 2014-04-12 | Discharge: 2014-04-12 | Disposition: A | Discharge status: Home / Self Care | Payer: PRIVATE HEALTH INSURANCE | Source: Ambulatory Visit | Attending: Obstetrics and Gynecology | Admitting: Obstetrics and Gynecology

## 2014-04-12 DIAGNOSIS — N6310 Unspecified lump in the right breast, unspecified quadrant: Secondary | ICD-10-CM

## 2014-04-22 ENCOUNTER — Other Ambulatory Visit: Payer: Self-pay | Admitting: Obstetrics and Gynecology

## 2014-04-22 ENCOUNTER — Ambulatory Visit
Admission: RE | Admit: 2014-04-22 | Discharge: 2014-04-22 | Disposition: A | Discharge status: Home / Self Care | Payer: PRIVATE HEALTH INSURANCE | Source: Ambulatory Visit | Attending: Obstetrics and Gynecology | Admitting: Obstetrics and Gynecology

## 2014-04-22 DIAGNOSIS — N6310 Unspecified lump in the right breast, unspecified quadrant: Secondary | ICD-10-CM

## 2014-04-25 ENCOUNTER — Other Ambulatory Visit: Payer: Self-pay | Admitting: Obstetrics and Gynecology

## 2014-04-25 ENCOUNTER — Ambulatory Visit
Admission: RE | Admit: 2014-04-25 | Discharge: 2014-04-25 | Disposition: A | Discharge status: Home / Self Care | Payer: PRIVATE HEALTH INSURANCE | Source: Ambulatory Visit | Attending: Obstetrics and Gynecology | Admitting: Obstetrics and Gynecology

## 2014-04-25 DIAGNOSIS — C50911 Malignant neoplasm of unspecified site of right female breast: Secondary | ICD-10-CM

## 2014-04-25 DIAGNOSIS — N6310 Unspecified lump in the right breast, unspecified quadrant: Secondary | ICD-10-CM

## 2014-04-26 ENCOUNTER — Telehealth: Payer: Self-pay | Admitting: *Deleted

## 2014-04-26 ENCOUNTER — Encounter: Payer: Self-pay | Admitting: *Deleted

## 2014-04-26 DIAGNOSIS — Z170 Estrogen receptor positive status [ER+]: Secondary | ICD-10-CM

## 2014-04-26 DIAGNOSIS — C50411 Malignant neoplasm of upper-outer quadrant of right female breast: Secondary | ICD-10-CM | POA: Insufficient documentation

## 2014-04-26 HISTORY — DX: Malignant neoplasm of upper-outer quadrant of right female breast: C50.411

## 2014-04-26 NOTE — Telephone Encounter (Signed)
Confirmed [facility] for 05/04/14 at 1200 .  Instructions and contact information given.

## 2014-05-02 ENCOUNTER — Ambulatory Visit
Admission: RE | Admit: 2014-05-02 | Discharge: 2014-05-02 | Disposition: A | Discharge status: Home / Self Care | Payer: PRIVATE HEALTH INSURANCE | Source: Ambulatory Visit | Attending: Obstetrics and Gynecology | Admitting: Obstetrics and Gynecology

## 2014-05-02 DIAGNOSIS — C50911 Malignant neoplasm of unspecified site of right female breast: Secondary | ICD-10-CM

## 2014-05-02 MED ORDER — GADOBENATE DIMEGLUMINE 529 MG/ML IV SOLN
15.0000 mL | Freq: Once | INTRAVENOUS | Status: AC | PRN
  Administered 2014-05-02: 15 mL via INTRAVENOUS

## 2014-05-04 ENCOUNTER — Encounter: Payer: Self-pay | Admitting: Physical Therapy

## 2014-05-04 ENCOUNTER — Ambulatory Visit: Payer: PRIVATE HEALTH INSURANCE | Attending: Surgery | Admitting: Physical Therapy

## 2014-05-04 ENCOUNTER — Ambulatory Visit
Admission: RE | Admit: 2014-05-04 | Discharge: 2014-05-04 | Disposition: A | Discharge status: Home / Self Care | Payer: PRIVATE HEALTH INSURANCE | Source: Ambulatory Visit | Attending: Radiation Oncology | Admitting: Radiation Oncology

## 2014-05-04 ENCOUNTER — Encounter: Payer: Self-pay | Admitting: Oncology

## 2014-05-04 ENCOUNTER — Ambulatory Visit (INDEPENDENT_AMBULATORY_CARE_PROVIDER_SITE_OTHER): Payer: Self-pay | Admitting: Surgery

## 2014-05-04 ENCOUNTER — Encounter: Payer: Self-pay | Admitting: General Practice

## 2014-05-04 ENCOUNTER — Ambulatory Visit (HOSPITAL_BASED_OUTPATIENT_CLINIC_OR_DEPARTMENT_OTHER): Payer: PRIVATE HEALTH INSURANCE | Admitting: Oncology

## 2014-05-04 ENCOUNTER — Other Ambulatory Visit: Payer: Self-pay | Admitting: Radiation Oncology

## 2014-05-04 ENCOUNTER — Ambulatory Visit: Payer: PRIVATE HEALTH INSURANCE

## 2014-05-04 ENCOUNTER — Other Ambulatory Visit (HOSPITAL_BASED_OUTPATIENT_CLINIC_OR_DEPARTMENT_OTHER): Payer: PRIVATE HEALTH INSURANCE

## 2014-05-04 ENCOUNTER — Telehealth: Payer: Self-pay | Admitting: Oncology

## 2014-05-04 VITALS — BP 133/90 | HR 85 | Temp 98.6°F | Resp 18 | Ht 66.0 in | Wt 178.4 lb

## 2014-05-04 DIAGNOSIS — C50919 Malignant neoplasm of unspecified site of unspecified female breast: Secondary | ICD-10-CM

## 2014-05-04 DIAGNOSIS — C50911 Malignant neoplasm of unspecified site of right female breast: Secondary | ICD-10-CM

## 2014-05-04 DIAGNOSIS — Z9189 Other specified personal risk factors, not elsewhere classified: Secondary | ICD-10-CM | POA: Diagnosis not present

## 2014-05-04 DIAGNOSIS — C50411 Malignant neoplasm of upper-outer quadrant of right female breast: Secondary | ICD-10-CM

## 2014-05-04 DIAGNOSIS — K7689 Other specified diseases of liver: Secondary | ICD-10-CM | POA: Insufficient documentation

## 2014-05-04 DIAGNOSIS — Z170 Estrogen receptor positive status [ER+]: Secondary | ICD-10-CM

## 2014-05-04 DIAGNOSIS — L301 Dyshidrosis [pompholyx]: Secondary | ICD-10-CM

## 2014-05-04 LAB — CBC WITH DIFFERENTIAL/PLATELET
BASO%: 0.5 % (ref 0.0–2.0)
BASOS ABS: 0 10*3/uL (ref 0.0–0.1)
EOS ABS: 0.1 10*3/uL (ref 0.0–0.5)
EOS%: 1.1 % (ref 0.0–7.0)
HEMATOCRIT: 44.7 % (ref 34.8–46.6)
HGB: 14.8 g/dL (ref 11.6–15.9)
LYMPH%: 32 % (ref 14.0–49.7)
MCH: 32.4 pg (ref 25.1–34.0)
MCHC: 33.1 g/dL (ref 31.5–36.0)
MCV: 97.7 fL (ref 79.5–101.0)
MONO#: 0.5 10*3/uL (ref 0.1–0.9)
MONO%: 8 % (ref 0.0–14.0)
NEUT%: 58.4 % (ref 38.4–76.8)
NEUTROS ABS: 3.7 10*3/uL (ref 1.5–6.5)
Platelets: 376 10*3/uL (ref 145–400)
RBC: 4.57 10*6/uL (ref 3.70–5.45)
RDW: 12.6 % (ref 11.2–14.5)
WBC: 6.3 10*3/uL (ref 3.9–10.3)
lymph#: 2 10*3/uL (ref 0.9–3.3)

## 2014-05-04 LAB — COMPREHENSIVE METABOLIC PANEL (CC13)
ALBUMIN: 3.9 g/dL (ref 3.5–5.0)
ALT: 10 U/L (ref 0–55)
AST: 17 U/L (ref 5–34)
Alkaline Phosphatase: 61 U/L (ref 40–150)
Anion Gap: 12 mEq/L — ABNORMAL HIGH (ref 3–11)
BUN: 12.5 mg/dL (ref 7.0–26.0)
CALCIUM: 9.6 mg/dL (ref 8.4–10.4)
CHLORIDE: 105 meq/L (ref 98–109)
CO2: 25 meq/L (ref 22–29)
Creatinine: 0.8 mg/dL (ref 0.6–1.1)
GLUCOSE: 115 mg/dL (ref 70–140)
POTASSIUM: 4.5 meq/L (ref 3.5–5.1)
SODIUM: 141 meq/L (ref 136–145)
TOTAL PROTEIN: 7.1 g/dL (ref 6.4–8.3)
Total Bilirubin: 0.8 mg/dL (ref 0.20–1.20)

## 2014-05-04 NOTE — Progress Notes (Signed)
Checked in new pt with no financial concerns prior to seeing the dr. Informed pt if chemo is part of her treatment I will contact her ins to see if Josem Kaufmann is req and will obtain it if it is as well as contact foundations that offer copay assistance for chemo if needed. She has my card for any billing questions or concerns.

## 2014-05-04 NOTE — Progress Notes (Signed)
[place]  [phone] [phone]     ID: Brooke Ferguson DOB: June 29, 1970  MR#: [id number]  JQZ#:[id number]  Patient Care Team: Jefm Petty, MD as PCP - General (Family Medicine) Erroll Luna, MD as Consulting Physician (General Surgery) Chauncey Cruel, MD as Consulting Physician (Oncology) Rexene Edison, MD as Consulting Physician (Radiation Oncology) Roselee Culver, RN as Registered Nurse [facility] Caleen Jobs, RN as Registered Nurse OTHER MD:  CHIEF COMPLAINT: early-stage estrogen receptor positive breast cancer  CURRENT TREATMENT: definitive surgery pending   BREAST CANCER HISTORY: [name] has received yearly mammography since age 44. She has breast density category C. On 09/30/2013 bilateral screening mammography showed a possible asymmetry in the right breast. Right diagnostic mammography with tomosynthesis and right breast ultrasonography was performed 10/08/2013. On spot compression views there appears to be a persistent mixed attenuation mass in the right breast suggesting an intramammary lymph node. Ultrasound showed several small cysts not related to this finding. There was a diagnosed in her graphic abnormality noted associated with a possible abnormality.   Accordingly 6 month follow-up was suggested and performed 04/12/2014. On this date right diagnostic mammography and ultrasonography showed an oval 5 mm mass with indistinct med margins in the upper right breast. This was not palpable. Targeted ultrasound now found a hypoechoic mass measuring 5 mm in the area in question, and this was biopsied 04/22/2014. 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  On 05/02/2014 the patient  underwent bilateral breast MRI. This found the breast density to be category B. In the upper outer quadrant of the right breast there was a 6 mm enhancing mass with slightly irregular margins. There were no abnormal lymph nodes and the left breast was unremarkable. In the upper abdomen and left upper quadrant there were innumerable cysts, some measuring up to 6 cm in size.  The patient's subsequent history is as detailed below.  INTERVAL HISTORY: She'll was evaluated in the multidisciplinary breast cancer clinic 05/04/2014 accompanied by her husband Gerald Stabs. Her case was also presented at the multidisciplinary breast cancer conference that same morning. The preliminary plan suggested at that meeting was for breast conserving surgery radiation and anti-estrogens. The question of chemotherapy would depend on the ultimate size of the tumor and might require her Oncotype. It was felt the patient would benefit from genetics testing.  REVIEW OF SYSTEMS: There were no specific symptoms leading to the original mammogram, which was routinely scheduled. Kadence exercises chiefly by walking her dog. She denies unusual headaches, visual changes, nausea, vomiting, stiff neck, dizziness, or gait imbalance. There has been no cough, phlegm production, or pleurisy, no chest pain or pressure, and no change in bowel or bladder habits. The patient denies fever, rash, bleeding, unexplained fatigue or unexplained weight loss. [name] does complain of dyshidrotic eczema, which was diagnosed about a year ago and involves chiefly the hands. A detailed review of systems was otherwise entirely negative.  PAST MEDICAL HISTORY: Past Medical History  Diagnosis Date  . Bulging disc     BETWEEN C6-7, HAS HAD 2 CORTISONE INJECTIONS  . PONV (postoperative nausea and vomiting) 10/2010    nausea  . 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 Breast cancer of upper-outer quadrant of right female breast  04/26/2014    PAST  SURGICAL HISTORY: Past Surgical History  Procedure Laterality Date  . Hysteroscopy with resectoscope  10/25/2010    Procedure: HYSTEROSCOPY WITH RESECTOSCOPE;  Surgeon: Lovenia Kim, MD;  Location: [facility];  Service: Gynecology;  Laterality: N/A;  Polypectomy  . Wisdom tooth extraction    . Dilation and curettage of uterus    . Diagnostic laparoscopy      OVARIAN CYCTS AT AGE 44 YRS    FAMILY HISTORY History reviewed. No pertinent family history. The patient's father is alive, age 45 as of 11-May-2014. The patient's mother died at age 104 from an astrocytoma which was diagnosed a year earlier. The patient has no brothers, one sister. There is no history of breast cancer in the the immediate family. On the maternal side however the patient's mother's grandmother lived to be 66 but had been diagnosed with breast cancer at some point. There are also cases of lung cancer, esophageal cancer, and colon cancer on the mother's side of the family. There are no other breast or ovarian cancers to the patient's knowledge  GYNECOLOGIC HISTORY:  Patient's last menstrual period was 04/18/2014. Menarche age 44. The patient is GX P0. She still having regular periods. She used oral contraceptives between the ages of 45 and 27. She underwent hysteroscopy with "scraping" for uterine polyps in 05/11/10. She also is status post partial oophorectomy on the right because of an ovarian cyst.  SOCIAL HISTORY:  She'll works as a Adult nurse. Gerald Stabs works for a [employer] account pl[employer] (they do a lot of the things testing for defibrillators.). A stepson, Zack room and, 81, lives with them. He is currently a Ship broker. The patient is not a church attender    ADVANCED DIRECTIVES: not in place   HEALTH MAINTENANCE: History  Substance Use Topics  . Smoking status: Never Smoker   . Smokeless tobacco: Never Used  . Alcohol Use: Yes     Comment: SOCIALLY     Colonoscopy:never  TGY:[facility] 05/10/2013  Bone density:never  Lipid  panel:  Allergies  Allergen Reactions  . Vicodin [Hydrocodone-Acetaminophen] Nausea And Vomiting    Current Outpatient Prescriptions  Medication Sig Dispense Refill  . betamethasone dipropionate (DIPROLENE) 0.05 % cream Apply topically daily.     No current facility-administered medications for this visit.    OBJECTIVE: young White woman who appears well  Filed Vitals:   05/04/14 1250  BP: 133/90  Pulse: 85  Temp: 98.6 F (37 C)  Resp: 18     Body mass index is 28.81 kg/(m^2).    ECOG FS:0 - Asymptomatic  Ocular: Sclerae unicteric, pupils equal, round and reactive to light Ear-nose-throat: Oropharynx clearand moist Lymphatic: No cervical or supraclavicular adenopathy Lungs no rales or rhonchi, good excursion bilaterally Heart regular rate and rhythm, no murmur appreciated Abd soft, nontender, positive bowel sounds, no masses palpated MSK no focal spinal tenderness, no joint edema Neuro: non-focal, well-oriented, appropriate affect Breasts: the right breast is status post recent biopsy. There is a minimal ecchymosis. I do not palpate a mass. There are no other skin or nipple changes of concern. The right axilla is benign. The left breast is unremarkable   LAB RESULTS:  CMP     Component Value Date/Time   NA 141 05/04/2014 1144   NA 141 03/18/2011 1200   K 4.5 05/04/2014 1144   K 3.7 03/18/2011 1200   CL 107 03/18/2011 1200   CO2 25 05/04/2014 1144   CO2 25 03/18/2011 1200  GLUCOSE 115 05/04/2014 1144   GLUCOSE 91 03/18/2011 1200   BUN 12.5 05/04/2014 1144   BUN 14 03/18/2011 1200   CREATININE 0.8 05/04/2014 1144   CREATININE 0.70 03/18/2011 1200   CALCIUM 9.6 05/04/2014 1144   CALCIUM 9.2 03/18/2011 1200   PROT 7.1 05/04/2014 1144   PROT 6.5 03/18/2011 1200   ALBUMIN 3.9 05/04/2014 1144   ALBUMIN 3.5 03/18/2011 1200   AST 17 05/04/2014 1144   AST 13 03/18/2011 1200   ALT 10 05/04/2014 1144   ALT 11 03/18/2011 1200   ALKPHOS 61 05/04/2014 1144   ALKPHOS  42 03/18/2011 1200   BILITOT 0.80 05/04/2014 1144   BILITOT 0.3 03/18/2011 1200   GFRNONAA >90 03/18/2011 1200   GFRAA >90 03/18/2011 1200    INo results found for: SPEP, UPEP  Lab Results  Component Value Date   WBC 6.3 05/04/2014   NEUTROABS 3.7 05/04/2014   HGB 14.8 05/04/2014   HCT 44.7 05/04/2014   MCV 97.7 05/04/2014   PLT 376 05/04/2014      Chemistry      Component Value Date/Time   NA 141 05/04/2014 1144   NA 141 03/18/2011 1200   K 4.5 05/04/2014 1144   K 3.7 03/18/2011 1200   CL 107 03/18/2011 1200   CO2 25 05/04/2014 1144   CO2 25 03/18/2011 1200   BUN 12.5 05/04/2014 1144   BUN 14 03/18/2011 1200   CREATININE 0.8 05/04/2014 1144   CREATININE 0.70 03/18/2011 1200      Component Value Date/Time   CALCIUM 9.6 05/04/2014 1144   CALCIUM 9.2 03/18/2011 1200   ALKPHOS 61 05/04/2014 1144   ALKPHOS 42 03/18/2011 1200   AST 17 05/04/2014 1144   AST 13 03/18/2011 1200   ALT 10 05/04/2014 1144   ALT 11 03/18/2011 1200   BILITOT 0.80 05/04/2014 1144   BILITOT 0.3 03/18/2011 1200       No results found for: LABCA2  No components found for: LABCA125  No results for input(s): INR in the last 168 hours.  Urinalysis    Component Value Date/Time   COLORURINE YELLOW 03/18/2011 1322   APPEARANCEUR CLOUDY* 03/18/2011 1322   LABSPEC 1.026 03/18/2011 1322   PHURINE 6.0 03/18/2011 1322   GLUCOSEU NEGATIVE 03/18/2011 1322   HGBUR NEGATIVE 03/18/2011 1322   BILIRUBINUR SMALL* 03/18/2011 1322   KETONESUR >80* 03/18/2011 1322   PROTEINUR NEGATIVE 03/18/2011 1322   UROBILINOGEN 0.2 03/18/2011 1322   NITRITE NEGATIVE 03/18/2011 1322   LEUKOCYTESUR NEGATIVE 03/18/2011 1322    STUDIES: Mr Breast Bilateral W Wo Contrast  05/02/2014   CLINICAL DATA:  Recently diagnosed right breast invasive ductal carcinoma, following stereotactic core biopsy.  LABS:  None performed  EXAM: BILATERAL BREAST MRI WITH AND WITHOUT CONTRAST  TECHNIQUE: Multiplanar, multisequence MR  images of both breasts were obtained prior to and following the intravenous administration of 15 ml of Multihance.  THREE-DIMENSIONAL MR IMAGE RENDERING ON INDEPENDENT WORKSTATION:  Three-dimensional MR images were rendered by post-processing of the original MR data on an independent workstation. The three-dimensional MR images were interpreted, and findings are reported in the following complete MRI report for this study. Three dimensional images were evaluated at the independent DynaCad workstation  COMPARISON:  Previous exam(s) performed at the [facility] including diagnostic right mammogram a right breast ultrasound dated 04/12/2014, report firm right breast stereotactic core needle biopsy dated 04/22/2014, post biopsy mammogram of the right breast 04/22/2014, and the left diagnostic mammogram 04/25/2014.  FINDINGS: Breast composition: b.  Scattered fibroglandular tissue.  Background parenchymal enhancement: Moderate  Right breast: There is a 6 mm slightly irregular enhancing mass with washout kinetics in the far superior right breast, upper outer quadrant, middle third in the anterior to posterior Biopsy clip artifact is along the lateral margin of the mass. There is an adjacent biopsy tract demonstrating slight enhancement. No additional suspicious masses or suspicious enhancement is identified in the right breast.  Left breast: No mass or abnormal enhancement.  Lymph nodes: No abnormal appearing lymph nodes.  Ancillary findings: In the incidentally imaged portion of the upper abdomen, there are innumerable hepatic cysts. Additionally, there are innumerable cysts within the imaged portion of the left upper quadrant; on these images, the origin of the left upper quadrant cysts is uncertain. Imaged cysts measure up to 6 cm in size.  IMPRESSION: 1. Biopsy proven malignancy in the upper-outer quadrant of the right breast measures approximately 6 mm greatest diameter on MRI. No additional  areas of concern are seen in the right breast. 2. No MRI evidence of malignancy in the left breast. 3. Innumerable hepatic and left upper quadrant abdominal cysts. No prior abdominal imaging is available. Question any family history of polycystic kidney disease? Abdominal MRI with without contrast is recommended for further evaluation.  RECOMMENDATION: 1. Treatment planning for biopsy proven malignancy in the right breast. 2. Abdominal MRI with and without contrast should be considered for evaluation of innumerable, partially imaged upper abdominal cysts.  BI-RADS CATEGORY  6: Known biopsy-proven malignancy.   Electronically Signed   By: [name] M.D.   On: 05/02/2014 11:43   Mm Digital Diagnostic Unilat L  04/25/2014   CLINICAL DATA:  Newly diagnosed right breast cancer. Pre MRI evaluation.  EXAM: DIGITAL DIAGNOSTIC LEFT MAMMOGRAM WITH CAD  COMPARISON:  09/30/2013  ACR Breast Density Category c: The breast tissue is heterogeneously dense, which may obscure small masses.  FINDINGS: No suspicious mass, distortion, or microcalcifications are identified to suggest presence of malignancy.  Mammographic images were processed with CAD.  IMPRESSION: No mammographic evidence for malignancy.  RECOMMENDATION: 1. Treatment plan for known right breast malignancy. 2. Left mammogram suggested in 1 year.  I have discussed the findings and recommendations with the patient. Results were also provided in writing at the conclusion of the visit. If applicable, a reminder letter will be sent to the patient regarding the next appointment.  BI-RADS CATEGORY  1: Negative.   Electronically Signed   By: Nolon Nations M.D.   On: 04/25/2014 17:12   Mm Digital Diagnostic Unilat R  04/22/2014   CLINICAL DATA:  Mass posterior upper-outer quadrant right breast  EXAM: DIAGNOSTIC RIGHT MAMMOGRAM POST STEREOTACTIC BIOPSY  COMPARISON:  Previous exam(s).  FINDINGS: Mammographic images were obtained following stereotactic guided biopsy of  a mass posteriorly in the right upper outer quadrant. Marker clip projects over the mass appropriately.  IMPRESSION: Appropriate clip position  Final Assessment: Post Procedure Mammograms for Marker Placement   Electronically Signed   By: [name] M.D.   On: 04/22/2014 11:21   Mm Digital Diagnostic Unilat R  04/12/2014   CLINICAL DATA:  Followup right breast mass 12 o'clock location  EXAM: DIGITAL DIAGNOSTIC right MAMMOGRAM WITH CAD  ULTRASOUND right BREAST  COMPARISON:  Prior exams  ACR Breast Density Category c: The breast tissue is heterogeneously dense, which may obscure small masses.  FINDINGS: There is an oval 5 mm mass with minimally indistinct margins in the right breast superiorly,  in the approximate 12 o'clock location based on review of previous imaging, corresponding to the finding indicated for short term followup. No new suspicious finding is identified in the right breast.  Mammographic images were processed with CAD.  On physical exam, I palpate no abnormality in the right breast 12 o'clock location.  Targeted ultrasound is performed, showing a hypoechoic shadowing mass with peripheral echogenic rim in the right breast 12 o'clock location 7 cm from the nipple measuring 5 x 4 x 3 mm. This corresponds to the mammographic finding. No right axillary lymphadenopathy.  IMPRESSION: Suspicious mass, right breast 12 o'clock location. Ultrasound-guided core biopsy will be scheduled and performed at the patient's convenience.  RECOMMENDATION: Right ultrasound-guided core biopsy  I have discussed the findings and recommendations with the patient. Results were also provided in writing at the conclusion of the visit. If applicable, a reminder letter will be sent to the patient regarding the next appointment.  BI-RADS CATEGORY  4: Suspicious.   Electronically Signed   By: Conchita Paris M.D.   On: 04/12/2014 11:47   Mm Radiologist Eval And Mgmt  04/25/2014   EXAM: ESTABLISHED PATIENT OFFICE VISIT -  LEVEL II  CHIEF COMPLAINT: The patient returns for results following right breast stereotactic core biopsy. She is accompanied by her husband.  Current Pain Level: 3  HISTORY OF PRESENT ILLNESS: Mammography demonstrated a small suspicious mass located within the upper outer quadrant of the right breast. Stereotactic core biopsy was performed on 04/22/2014. Patient has no family history of breast cancer or ovarian cancer. Patient states that her mother had of malignant brain tumor.  EXAM: The biopsy site is clean and dry without hematoma formation or signs of infection.  PATHOLOGY: The pathology demonstrated invasive ductal carcinoma felt to be grade 1.  ASSESSMENT AND PLAN: ASSESSMENT AND PLAN Breast MRI is scheduled for 05/02/2014. Patient is scheduled for the breast cancer multi disciplinary clinic on Wednesday 05/04/2014. Post biopsy wound care instructions were reviewed with the patient and her husband. The patient was encouraged to call our breast center for additional questions or concerns. Educational materials were given to the patient.   Electronically Signed   By: Altamese Cabal M.D.   On: 04/25/2014 16:07   US Breast Ltd Uni Right Inc Axilla  04/12/2014   CLINICAL DATA:  Followup right breast mass 12 o'clock location  EXAM: DIGITAL DIAGNOSTIC right MAMMOGRAM WITH CAD  ULTRASOUND right BREAST  COMPARISON:  Prior exams  ACR Breast Density Category c: The breast tissue is heterogeneously dense, which may obscure small masses.  FINDINGS: There is an oval 5 mm mass with minimally indistinct margins in the right breast superiorly, in the approximate 12 o'clock location based on review of previous imaging, corresponding to the finding indicated for short term followup. No new suspicious finding is identified in the right breast.  Mammographic images were processed with CAD.  On physical exam, I palpate no abnormality in the right breast 12 o'clock location.  Targeted ultrasound is performed, showing a  hypoechoic shadowing mass with peripheral echogenic rim in the right breast 12 o'clock location 7 cm from the nipple measuring 5 x 4 x 3 mm. This corresponds to the mammographic finding. No right axillary lymphadenopathy.  IMPRESSION: Suspicious mass, right breast 12 o'clock location. Ultrasound-guided core biopsy will be scheduled and performed at the patient's convenience.  RECOMMENDATION: Right ultrasound-guided core biopsy  I have discussed the findings and recommendations with the patient. Results were also provided in writing at the  conclusion of the visit. If applicable, a reminder letter will be sent to the patient regarding the next appointment.  BI-RADS CATEGORY  4: Suspicious.   Electronically Signed   By: Conchita Paris M.D.   On: 04/12/2014 11:47   Mm Rt Breast Bx W Loc Dev 1st Lesion Image Bx Spec Stereo Guide  04/22/2014   CLINICAL DATA:  Mass posterior upper-outer quadrant right breast  EXAM: RIGHT BREAST STEREOTACTIC CORE NEEDLE BIOPSY  COMPARISON:  Previous exams.  FINDINGS: The patient and I discussed the procedure of stereotactic-guided biopsy including benefits and alternatives. We discussed the high likelihood of a successful procedure. We discussed the risks of the procedure including infection, bleeding, tissue injury, clip migration, and inadequate sampling. Informed written consent was given. The usual time out protocol was performed immediately prior to the procedure.  Using sterile technique and 2% Lidocaine as local anesthetic, under stereotactic guidance, a 9 gauge vacuum assisted device was used to perform core needle biopsy of a mass in the posterior upper-outer quadrant of the right breast using a lateral medial approach.  At the conclusion of the procedure, a coil shaped tissue marker clip was deployed into the biopsy cavity. Follow-up 2-view mammogram was performed and is dictated separately.  IMPRESSION: Stereotactic-guided biopsy of right breast mass. No apparent  complications.   Electronically Signed   By: [name] M.D.   On: 04/22/2014 11:20    ASSESSMENT: 44 y.o. [place] woman status post right breast upper outer quadrant biopsy 04/22/2014 for a clinicalT1b N0, stage IA  Invasive ductal carcinoma, grade 1, strongly estrogen and progesterone receptor positive, HER-2 negative, with an MIB-1 of 12%   (1) genetics testing pending   (2) breast conserving surgery with sentinel lymph node sampling planned   (3) depending on surgical results the patient may benefit from an Oncotype DX test   (4)  The patient will benefit from adjuvant radiation   (5) anti-estrogens to follow radiation   (6 hepatic cysts : abdominal MRI pending  PLAN: We spent the better part of today's hour-long appointment discussing the biology of breast cancer in general, and the specifics of the patient's tumor in particular.Adjoa has a good understanding of the difference between local treatments for breast cancer such as surgery and radiation, and systemic treatment, such as antiestrogen pills and chemotherapy. She understands that because her breast cancer does not overexpress HER-2 anti-HER-2 treatments will not be indicated.  She will clearly benefit from antiestrogen's, both in terms of treatment of recurrent cancer and prevention of a new breast cancer developing in the future. Since she is still premenopausal this will mean tamoxifen and she can start reading off on that option, which we will discuss at greater length once she has finished her local treatment.  The remaining question is chemotherapy. For tumors 5 mm or less chemotherapy is not indicated. For T1b tumors, chemotherapy needs to be considered and in that case an Oncotype may be helpful. For that reason I am making her return appointment with me more than 2 weeks out from the anticipated date of surgery so we will have an Oncotype result if that seems appropriate to send. I did reassure Natali today  that my expectation is that she will not benefit from chemotherapy since her tumor is not only small but also slow-growing and low-grade.( phenotypically a type sounds as a luminal a).  Incidentally on her MRI innumerable hepatic and upper abdominal cysts were noted. We are obtaining an abdominal MRI to  serve as a baseline, and 2give [place] an idea of whether the patient may have subclinical polycystic kidney disease.  Lyndee has a good understanding of the overall plan. She agrees with it. She knows the goal of treatment in her case is cure. She will call with any problems that may develop before her next visit here.  Chauncey Cruel, MD   05/04/2014 3:57 PM Medical Oncology and Hematology [facility] [address] [place], [place] [address] Tel. [phone]    Fax. [phone]

## 2014-05-04 NOTE — Therapy (Signed)
[place], [place], [address] Phone: [phone]   Fax:  [phone]  Physical Therapy Evaluation  Patient Details  Name: Brooke Ferguson MRN: [id number] Date of Birth: [birth year] Referring Provider:  Erroll Luna, MD  Encounter Date: 05/04/2014      PT End of Session - 05/04/14 1505    Visit Number 1   Number of Visits 1   PT Start Time 0932   PT Stop Time 1446   PT Time Calculation (min) 24 min   Activity Tolerance Patient tolerated treatment well   Behavior During Therapy [facility] for tasks assessed/performed      Past Medical History  Diagnosis Date  . Bulging disc     BETWEEN C6-7, HAS HAD 2 CORTISONE INJECTIONS  . PONV (postoperative nausea and vomiting) 10/2010    nausea  . 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 Breast cancer of upper-outer quadrant of right female breast 04/26/2014    Past Surgical History  Procedure Laterality Date  . Hysteroscopy with resectoscope  10/25/2010    Procedure: HYSTEROSCOPY WITH RESECTOSCOPE;  Surgeon: Lovenia Kim, MD;  Location: [facility];  Service: Gynecology;  Laterality: N/A;  Polypectomy  . Wisdom tooth extraction    . Dilation and curettage of uterus    . Diagnostic laparoscopy      OVARIAN CYCTS AT AGE 16 YRS    LMP 04/18/2014  Visit Diagnosis:  Carcinoma of upper-outer quadrant of right female breast - Plan: PT plan of care cert/re-cert  At risk for lymphedema - Plan: PT plan of care cert/re-cert      Subjective Assessment - 05/04/14 1447    Symptoms Patient was seen today in the breast multi-disciplinary clinic for her new diagnosis of right breast cancer.   Pertinent History Diagnosed 04/12/14 with right ER/PR positive, HER2 negative breast cancer with a Ki67 of 12%.  Mass is 5 mm in size.   Patient Stated Goals Lymphedema risk reduction and post op shoulder ROM HEP   Currently in Pain? No/denies   Multiple Pain Sites No           [facility] PT Assessment - 05/04/14 0001    Assessment   Medical Diagnosis Right breast cancer   Onset Date 04/12/14   Precautions   Precautions Other (comment)  active cancer   Restrictions   Weight Bearing Restrictions No   Balance Screen   Has the patient fallen in the past 6 months No   Has the patient had a decrease in activity level because of a fear of falling?  No   Is the patient reluctant to leave their home because of a fear of falling?  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   Leisure She does not exercise   Cognition   Overall Cognitive Status Within Functional Limits for tasks assessed   Posture/Postural Control   Posture/Postural Control No significant limitations   ROM / Strength   AROM / PROM / Strength AROM;Strength   AROM   AROM Assessment Site Shoulder   Right/Left Shoulder Right;Left   Right Shoulder Extension 43 Degrees   Right Shoulder Flexion 141 Degrees   Right Shoulder ABduction 171 Degrees   Right Shoulder Internal Rotation 72 Degrees   Right  Shoulder External Rotation 77 Degrees   Left Shoulder Extension 56 Degrees   Left Shoulder Flexion 146 Degrees   Left Shoulder ABduction 172 Degrees   Left Shoulder Internal Rotation 69 Degrees   Left Shoulder External Rotation 70 Degrees   Strength   Overall Strength Within functional limits for tasks performed           LYMPHEDEMA/ONCOLOGY QUESTIONNAIRE - 05/04/14 1502    Type   Cancer Type Right breast   Lymphedema Assessments   Lymphedema Assessments Upper extremities   Right Upper Extremity Lymphedema   10 cm Proximal to Olecranon Process 26.8 cm   Olecranon Process 24 cm   10 cm Proximal to Ulnar Styloid Process 20.4 cm   Just Proximal to Ulnar Styloid Process 14.5 cm   Across Hand at [employer] 17.5 cm   At [place] of 2nd Digit 5.8 cm   Left Upper Extremity Lymphedema   10 cm Proximal to Olecranon Process 26.8 cm   Olecranon Process 23 cm   10 cm Proximal to Ulnar Styloid Process 19.4 cm   Just Proximal to Ulnar Styloid Process 14.5 cm   Across Hand at [employer] 17.3 cm   At [place] of 2nd Digit 5.8 cm       Patient was instructed today in a home exercise program today for post op shoulder range of motion. These included active assist shoulder flexion in sitting, scapular retraction, wall walking with shoulder abduction, and hands behind head external rotation.  She was encouraged to do these twice a day, holding 3 seconds and repeating 5 times when permitted by her physician.         PT Education - 05/04/14 1504    Education provided Yes   Education Details Lymphedema risk reduction; post op shoulder ROM HEP   Person(s) Educated Patient;Spouse   Methods Explanation;Demonstration;Handout   Comprehension Verbalized understanding              Breast Clinic Goals - 05/04/14 1526    Patient will be able to verbalize understanding of pertinent lymphedema risk reduction practices relevant to her diagnosis specifically related to skin care.   Time 1   Period Days   Status Achieved   Patient will be able to return demonstrate and/or verbalize understanding of the post-op home exercise program related to regaining shoulder range of motion.   Time 1   Period Days   Status Achieved   Patient will be able to verbalize understanding of the importance of attending the postoperative After Breast Cancer Class for further lymphedema risk reduction education and therapeutic exercise.   Time 1   Period Days   Status Achieved              Plan - 05/04/14 1505    Clinical Impression Statement Patient was seen today for a baseline assessment of her newly diagnosed right breast cancer.  She is planning to have a right lumpectomy and sentinel node biopsy followed by  radiation and anti-estrogen therapy.  She will likely benefit from post op PT to regain shoulder ROM.   Pt will benefit from skilled therapeutic intervention in order to improve on the following deficits Decreased range of motion;Increased edema;Impaired UE functional use;Pain;Decreased strength;Decreased knowledge of precautions   Rehab Potential Excellent   Clinical Impairments Affecting Rehab Potential none   PT Frequency One time visit   PT Treatment/Interventions Patient/family education;Therapeutic exercise   Consulted and Agree with Plan of Care Patient;Family member/caregiver  Family Member Consulted husband     Patient will follow up at outpatient cancer rehab if needed following surgery.  If the patient requires physical therapy at that time, a specific plan will be dictated and sent to the referring physician for approval. The patient was educated today on appropriate basic range of motion exercises to begin post operatively and the importance of attending the After Breast Cancer class following surgery.  Patient was educated today on lymphedema risk reduction practices as it pertains to recommendations that will benefit the patient immediately following surgery.  She verbalized good understanding.  No additional physical therapy is indicated at this time.       Problem List Patient Active Problem List   Diagnosis Date Noted  . Breast cancer of upper-outer quadrant of right female breast 04/26/2014    Annia Friendly, PT 05/04/2014, 3:28 PM  [place], [place], [address] Phone: [phone]   Fax:  [phone]

## 2014-05-04 NOTE — Progress Notes (Signed)
[place] Psychosocial Distress Screening Clinical Social Work  Visited with Brooke Ferguson and husband at [facility] to introduce [place] team/resources, and to review distress screen per protocol.  The patient scored a 7 on the Psychosocial Distress Thermometer which indicates severe distress. Also assessed for distress and other psychosocial needs.   ONCBCN DISTRESS SCREENING 05/04/2014  Screening Type Initial Screening  Distress experienced in past week (1-10) 7  Emotional problem type Nervousness/Anxiety;Adjusting to illness  Spiritual/Religous concerns type Facing my mortality  Physical Problem type Sleep/insomnia;Constipation/diarrhea  Referral to support programs Yes  Other Spiritual Care, counseling interns   Brooke Ferguson was very stressed and ready to leave clinic when I visited.  [place] team and resources briefly, encouraging her to review print materials in more detail later (or to delegate to her husband or large group of women friends who are supporters).    Follow up needed: No.  At this time, pt desires space and distance for reflection.  She is aware of support resources to utilize when ready/if desired, but please also page as needs arise.  Thank you.  [place], [place]

## 2014-05-04 NOTE — Progress Notes (Signed)
Brooke Ferguson is a very pleasant 44 y.o. 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  Biopsy results revealed the tumor's hormone status as ER positive, PR positive, and HER2/neu negative. Ki67 is 12%.  She presents today with her husband to the [facility]) for treatment consideration and recommendations from the breast surgeon, radiation oncologist, and medical oncologist.     I briefly met with Brooke Ferguson and her husband during her [facility] visit today. We discussed the purpose of the Survivorship Clinic, which will include monitoring for recurrence, coordinating completion of age and gender-appropriate cancer screenings, promotion of overall wellness, as well as managing potential late/long-term side effects of anti-cancer treatments.    The treatment plan for Brooke Ferguson will likely include surgery, radiation therapy, and anti-estrogen therapy.  She will meet with the Genetics Counselor due to her current age at diagnosis of cancer.  As of today, the intent of treatment for Brooke Ferguson is cure, therefore she will be eligible for the Survivorship Clinic upon her completion of treatment.  Her survivorship care plan (SCP) document will be drafted and updated throughout the course of her treatment trajectory. She will receive the SCP in an office visit with myself in the Survivorship Clinic once she has completed treatment.   Brooke Ferguson was encouraged to ask questions and all questions were answered to her satisfaction.  She was given my business card and encouraged to contact me with any concerns regarding survivorship.  I look forward to participating in her care.   Mike Craze, NP [place] [phone]

## 2014-05-04 NOTE — H&P (Signed)
Brooke Ferguson 05/04/2014 8:17 AM Location: [facility] Patient #: [id number] DOB: 08-18-1970 Undefined / Language: Brooke Ferguson / Race: Undefined Female History of Present Illness Brooke Ferguson A. Brooke Schier MD; 05/04/2014 3:23 PM) Patient words: RIGHT BREAST CANCER   pt sent at the request of dr [name] for right breast cancer found on mammogram. pt denies pain or nipple discharge. no family hx of breast cancer. no mass or pain.      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  LABS: None performed  EXAM: BILATERAL BREAST MRI WITH AND WITHOUT CONTRAST  TECHNIQUE: Multiplanar, multisequence MR images of both breasts were obtained prior to and following the intravenous administration of 15 ml of Multihance.  THREE-DIMENSIONAL MR IMAGE RENDERING ON INDEPENDENT WORKSTATION:  Three-dimensional MR images were rendered by post-processing of the original MR data on an independent workstation. The three-dimensional MR images were interpreted, and findings are reported in the following complete MRI report for this study. Three dimensional images were evaluated at the independent DynaCad workstation  COMPARISON: Previous exam(s) performed at the [facility] including diagnostic right mammogram a right breast ultrasound dated 04/12/2014, report firm right breast stereotactic core needle biopsy dated 04/22/2014, post biopsy mammogram of the right breast 04/22/2014, and the left diagnostic mammogram 04/25/2014.  FINDINGS: Breast composition: b. Scattered fibroglandular tissue.  Background parenchymal enhancement: Moderate  Right breast: There is a 6 mm slightly irregular enhancing mass with washout kinetics in the far superior right breast, upper outer quadrant, middle third in the anterior to posterior Biopsy clip artifact is along the lateral margin of the mass. There is  an adjacent biopsy tract demonstrating slight enhancement. No additional suspicious masses or suspicious enhancement is identified in the right breast.  Left breast: No mass or abnormal enhancement.  Lymph nodes: No abnormal appearing lymph nodes.  Ancillary findings: In the incidentally imaged portion of the upper abdomen, there are innumerable hepatic cysts. Additionally, there are innumerable cysts within the imaged portion of the left upper quadrant; on these images, the origin of the left upper quadrant cysts is uncertain. Imaged cysts measure up to 6 cm in size.  IMPRESSION: 1. Biopsy proven malignancy in the upper-outer quadrant of the right breast measures approximately 6 mm greatest diameter on MRI. No additional areas of concern are seen in the right breast. 2. No MRI evidence of malignancy in the left breast. 3. Innumerable hepatic and left upper quadrant abdominal cysts. No prior abdominal imaging is available. Question any family history of polycystic kidney disease? Abdominal MRI with without contrast is recommended for further evaluation.  RECOMMENDATION: 1. Treatment planning for biopsy proven malignancy in the right breast. 2. Abdominal MRI with and without contrast should be considered for evaluation of innumerable, partially imaged upper abdominal cysts.  BI-RADS CATEGORY 6: Known biopsy-proven malignancy.   Electronically Signed By: [name] M.D. On: 05/02/2014 11:43   CLINICAL DATA: Mass posterior upper-outer quadrant right breast  EXAM: DIAGNOSTIC RIGHT MAMMOGRAM POST STEREOTACTIC BIOPSY  COMPARISON: Previous exam(s).  FINDINGS: Mammographic images were obtained following stereotactic guided biopsy of a mass posteriorly in the right upper outer quadrant. Marker clip projects over the mass appropriately.  IMPRESSION: Appropriate clip position  Final Assessment: Post Procedure Mammograms for Marker Placement   Electronically  Signed By: [name] M.D. On: 04/22/2014 11:21    ADDITIONAL INFORMATION: CHROMOGENIC IN-SITU HYBRIDIZATION Results: HER-2/NEU BY CISH - NEGATIVE. RESULT RATIO OF HER2: CEP 17 SIGNALS 1.21 AVERAGE HER2 COPY NUMBER  PER CELL 2.00 REFERENCE RANGE NEGATIVE HER2/Chr17 Ratio <2.0 and Average HER2 copy number <4.0 EQUIVOCAL HER2/Chr17 Ratio <2.0 and Average HER2 copy number 4.0 and <6.0 POSITIVE HER2/Chr17 Ratio >=2.0 and/or Average HER2 copy number >=6.0 [name] MD Pathologist, Electronic Signature ( Signed 04/28/2014) PROGNOSTIC INDICATORS - ACIS Results: IMMUNOHISTOCHEMICAL AND MORPHOMETRIC ANALYSIS BY THE AUTOMATED CELLULAR IMAGING SYSTEM (ACIS) Estrogen Receptor: 100%, POSITIVE, STRONG STAINING INTENSITY Progesterone Receptor: 97%, POSITIVE, STRONG STAINING INTENSITY Proliferation Marker Ki67: 12% REFERENCE RANGE ESTROGEN RECEPTOR NEGATIVE <1% POSITIVE =>1% PROGESTERONE RECEPTOR NEGATIVE <1% 1 of 3 FINAL for Brooke Ferguson ([id number]) ADDITIONAL INFORMATION:(continued) POSITIVE =>1% All controls stained appropriately [name] MD Pathologist, Electronic Signature ( Signed 04/27/2014) FINAL DIAGNOSIS Diagnosis Breast, right, needle core biopsy, mass, UOQ - INVASIVE DUCTAL CARCINOMA, GRADE I, INVOLVING MULTIPLE CORES, APPROXIMATELY 5 MM IN MAXIMUM LINEAR EXTENT IN ANY ONE CORE. - CALCIFICATIONS IDENTIFIED. - NO DUCTAL CARCINOMA IN SITU PRESENT. - BREAST PROGNOSTIC MARKERS WILL BE ORDERED ON BLOCK 1C AND SEPARATELY REPORTED. - SEE COMMENT. Microscopic Comment COMMENT: Internal departmental review obtained (Dr. Tresa Moore) with agreement. Results are phoned to [place] (04/25/14). (MEG:ds 04/25/14) [name] MD Pathologist, Electronic Signature (Case signed 04/25/2014) Specimen Gross and Clinical Information Specimen Comment Mass, 5 mm Specimen(s) Obtained: Breast, right, needle core biopsy, mass, UOQ Specimen  Clinical Information Lymph node? CA? Gross Received in formalin are 12 cores of soft, tan-red tissue which measure 0.6 x 0.3 x 0.1 cm and up to 5.7 x 0.4 x 0.3 cm. Also included are similar appearing soft tissue fragments which measures 1.8 x 1.0 x 0.5 cm in aggregate. Submitted in toto in 4 block(s) Time in Formalin 11:16 am Stain(s) used in Diagnosis: The following stain(s) were used in diagnosing the case: ER-ACIS, PR-ACIS, CISH, KI-67-ACIS. The control(s) stained appropriately. 2 of 3 FINAL for Schwalbe, Jerris ([id number]) Disclaimer Her2Neu by CISH (chromogenic in-situ hybridization) is performed at [facility], using the [place] pharmDx Kit (code number [id number]). This test is used to detect the amplification of the Her2Neu gene in interphase nuclei from formalin fixed, paraffin embedded tissue and is reported using ASCO/CAP scoring criteria published in 2013. PR progesterone receptor (PgR 636), immunohistochemical stains are performed on formalin fixed, paraffin embedded tissue using a 3,3"-diaminobenzidine (DAB) chromogen and DAKO Autostainer System. The staining intensity of the nucleus is scored morphometrically using the Automated Cellular Imaging System (ACIS) and is reported as the percentage of tumor cell nuclei demonstrating specific nuclear staining. Estrogen receptor (SP1), immunohistochemical stains are performed on formalin fixed, paraffin embedded tissue using a 3,3"-diaminobenzidine (DAB) chromogen and DAKO Autostainer System. The staining intensity of the nucleus is scored morphometrically using the Automated Cellular Imaging System (ACIS) and is reported as the percentage of tumor cell nuclei demonstrating specific nuclear staining. Ki-67 (Mib-1), immunohistochemical stains are performed on formalin fixed, paraffin embedded tissue using a 3,3"-diaminobenzidine (DAB) chromogen and [place]. The staining intensity of the nucleus is scored  morphometrically using the Automated Cellular Imaging System (ACIS) and is reported as the percentage of tumor cell nuclei demonstrating specific nuclear staining. Report signed out from the following location(s) Technical Component was performed at [facility],[place] [id number]., Interpretation was performed at [facility].  The patient is a 44 year old female   Other Problems Anderson Malta [place], [place]; 05/04/2014 8:17 AM) Anxiety Disorder Back Pain Hemorrhoids  Past Surgical History Anderson Malta [place], RMA; 05/04/2014 8:17 AM) Breast Biopsy Right. Oral Surgery  Diagnostic Studies History (  Jeanann Lewandowsky, RMA; 05/04/2014 8:17 AM) Colonoscopy never Mammogram within last year Pap Smear 1-5 years ago  Social History Anderson Malta [place], RMA; 05/04/2014 8:17 AM) Alcohol use Moderate alcohol use. Caffeine use Coffee. No drug use Tobacco use Never smoker.  Family History Anderson Malta [place], [place]; 05/04/2014 8:17 AM) Arthritis Family Members In General. Breast Cancer Family Members In General. Cancer Family Members In General, Mother. Cerebrovascular Accident Family Members In [place] Members In General. Colon Polyps Family Members In General. Diabetes Mellitus Mother, Sister. Heart Disease Family Members In General. Respiratory Condition Family Members In General.  Pregnancy / Birth History Jeanann Lewandowsky, [place]; 05/04/2014 8:17 AM) Age at menarche 39 years. Contraceptive History Depo-provera, Oral contraceptives. Gravida 0 Regular periods     Review of Systems Anderson Malta [place] RMA; 05/04/2014 8:17 AM) General Present- Fatigue and Night Sweats. Not Present- Appetite Loss, Chills, Fever, Weight Gain and Weight Loss. Skin Present- Dryness. Not Present- Change in Wart/Mole, Hives, Jaundice, New Lesions, Non-Healing Wounds, Rash and Ulcer. HEENT Not Present- Earache,  Hearing Loss, Hoarseness, Nose Bleed, Oral Ulcers, Ringing in the Ears, Seasonal Allergies, Sinus Pain, Sore Throat, Visual Disturbances, Wears glasses/contact lenses and Yellow Eyes. Respiratory Not Present- Bloody sputum, Chronic Cough, Difficulty Breathing, Snoring and Wheezing. Breast Not Present- Breast Mass, Breast Pain, Nipple Discharge and Skin Changes. Cardiovascular Not Present- Chest Pain, Difficulty Breathing Lying Down, Leg Cramps, Palpitations, Rapid Heart Rate, Shortness of Breath and Swelling of Extremities. Gastrointestinal Not Present- Abdominal Pain, Bloating, Bloody Stool, Change in Bowel Habits, Chronic diarrhea, Constipation, Difficulty Swallowing, Excessive gas, Gets full quickly at meals, Hemorrhoids, Indigestion, Nausea, Rectal Pain and Vomiting. Female Genitourinary Not Present- Frequency, Nocturia, Painful Urination, Pelvic Pain and Urgency. Musculoskeletal Not Present- Back Pain, Joint Pain, Joint Stiffness, Muscle Pain, Muscle Weakness and Swelling of Extremities. Neurological Present- Headaches. Not Present- Decreased Memory, Fainting, Numbness, Seizures, Tingling, Tremor, Trouble walking and Weakness. Psychiatric Present- Anxiety, Change in Sleep Pattern, Fearful and Frequent crying. Not Present- Bipolar and Depression. Endocrine Not Present- Cold Intolerance, Excessive Hunger, Hair Changes, Heat Intolerance, Hot flashes and New Diabetes. Hematology Present- Easy Bruising. Not Present- Excessive bleeding, Gland problems, HIV and Persistent Infections.   Physical Exam (Ellean Firman A. Atif Chapple MD; 05/04/2014 3:23 PM)  General Mental Status-Alert. General Appearance-Consistent with stated age. Hydration-Well hydrated. Voice-Normal.  Head and Neck Head-normocephalic, atraumatic with no lesions or palpable masses. Trachea-midline. Thyroid Gland Characteristics - normal size and consistency.  Eye Eyeball - Bilateral-Extraocular movements  intact. Sclera/Conjunctiva - Bilateral-No scleral icterus.  Chest and Lung Exam Chest and lung exam reveals -quiet, even and easy respiratory effort with no use of accessory muscles and on auscultation, normal breath sounds, no adventitious sounds and normal vocal resonance. Inspection Chest Wall - Normal. Back - normal.  Breast Breast - Left-Symmetric, Non Tender, No Biopsy scars, no Dimpling, No Inflammation, No Lumpectomy scars, No Mastectomy scars, No Peau d' Orange. Breast - Right-Symmetric, Non Tender, No Biopsy scars, no Dimpling, No Inflammation, No Lumpectomy scars, No Mastectomy scars, No Peau d' Orange. Breast Lump-No Palpable Breast Mass.  Cardiovascular Cardiovascular examination reveals -normal heart sounds, regular rate and rhythm with no murmurs and normal pedal pulses bilaterally.  Abdomen Inspection Inspection of the abdomen reveals - No Hernias. Skin - Scar - no surgical scars. Palpation/Percussion Palpation and Percussion of the abdomen reveal - Soft, Non Tender, No Rebound tenderness, No Rigidity (guarding) and No hepatosplenomegaly. Auscultation Auscultation of the abdomen reveals - Bowel sounds normal.  Neurologic Neurologic evaluation reveals -alert and oriented x  3 with no impairment of recent or remote memory. Mental Status-Normal.  Musculoskeletal Normal Exam - Left-Upper Extremity Strength Normal and Lower Extremity Strength Normal. Normal Exam - Right-Upper Extremity Strength Normal and Lower Extremity Strength Normal.  Lymphatic Head & Neck  General Head & Neck Lymphatics: Bilateral - Description - Normal. Axillary  General Axillary Region: Bilateral - Description - Normal. Tenderness - Non Tender. Femoral & Inguinal - Did not examine.    Assessment & Plan (Deslyn Cavenaugh A. Myrella Fahs MD; 05/04/2014 3:24 PM)  BREAST CANCER, RIGHT (174.9  C50.911) Impression: discusse breast conservation and mastectomy with reconstruction. pt opted  for right breast lumpectomy and SLN mapping. she will see genetics as well. Risk of lumpectomy include bleeding, infection, seroma, more surgery, use of seed/wire, wound care, cosmetic deformity and the need for other treatments, death , blood clots, death. Pt agrees to proceed. Risk of sentinel lymph node mapping include bleeding, infection, lymphedema, shoulder pain. stiffness, dye allergy. cosmetic deformity , blood clots, death, need for more surgery. Pt agres to proceed.

## 2014-05-04 NOTE — Progress Notes (Signed)
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 female who is seen today through the courtesy of Dr. Brantley Stage at the [facility] for evaluation of her T1 N0 invasive ductal carcinoma of the right breast.  There was the question of a right breast mass on mammography back in August.  A six-month follow-up was recommended in this showed an oval 0.5 cm mass at 12:00 within the right breast.  There is no palpable abnormality.  Ultrasound showed a 0.5 x 0.4 x 0.3 cm mass at 12:00.  The axilla appeared to be normal.  A stereotactic biopsy on 04/22/2014 was diagnostic for grade I invasive ductal carcinoma which was ER positive at 100%, PR positive at 97% with a Ki-67 of 12%.  HER-2/neu negative.  It was not clear as to whether not the questionable mass seen on ultrasound was actually the lesion that was biopsied.  Breast MR on February 29 showed a 0.6 cm mass within the upper-outer quadrant of the right breast.  There were numerous hepatic cysts questioning the possibility of polycystic kidney disease.  She is scheduled for a second look ultrasound to see if the biopsy clip was a same mass seen on ultrasound.  She is seen today with Dr. Brantley Stage and Dr. Dr. Jana Hakim.  PREVIOUS RADIATION THERAPY: No   PAST MEDICAL HISTORY:  has a past medical history of Bulging disc; PONV (postoperative nausea and vomiting) (10/2010); Arthritis; and Breast cancer of upper-outer quadrant of right female breast (04/26/2014).     PAST SURGICAL HISTORY:  Past Surgical History  Procedure Laterality Date  . Hysteroscopy with resectoscope  10/25/2010    Procedure: HYSTEROSCOPY WITH  RESECTOSCOPE;  Surgeon: Lovenia Kim, MD;  Location: [facility];  Service: Gynecology;  Laterality: N/A;  Polypectomy  . Wisdom tooth extraction    . Dilation and curettage of uterus    . Diagnostic laparoscopy      OVARIAN CYCTS AT AGE 5 YRS     FAMILY HISTORY: Her father is alive at 83, medical health is unknown.  Her mother died from a brain astrocytoma at age 79.  A great maternal grandmother was diagnosed with breast cancer, unknown age.   SOCIAL HISTORY:  reports that she has never smoked. She has never used smokeless tobacco. She reports that she drinks alcohol. She reports that she does not use illicit drugs.  Married, one stepchild.  She works as a Adult nurse.   ALLERGIES: Vicodin   MEDICATIONS:  Current Outpatient Prescriptions  Medication Sig Dispense Refill  . betamethasone dipropionate (DIPROLENE) 0.05 % cream Apply topically daily.     No current facility-administered medications for this encounter.     REVIEW OF SYSTEMS:  Pertinent items are noted in HPI.    PHYSICAL EXAM: Alert and oriented 44 year old female appearing her stated age. Wt Readings from Last 3 Encounters:  05/04/14 178 lb 6.4 oz (80.922 kg)  04/10/11 166 lb (75.297 kg)  10/25/10 175 lb (79.379 kg)   Temp Readings from Last 3 Encounters:  05/04/14 98.6 F (37 C) Oral  04/10/11 98.1 F (  36.7 C)   03/18/11 98.4 F (36.9 C) Oral   BP Readings from Last 3 Encounters:  05/04/14 133/90  04/10/11 108/77  03/18/11 132/82   Pulse Readings from Last 3 Encounters:  05/04/14 85  04/10/11 61  03/18/11 74   Head and neck examination: Grossly unremarkable.  Nodes: Without palpable cervical, supraclavicular, or axillary lymphadenopathy.  Chest: Lungs clear.  Breasts: She has moderate to large breasts.  There is a biopsy wound at approximately 12:00 along the superior  right breast.  No masses are appreciated.  Left breast without masses or lesions.  Extremities: Without edema.    LABORATORY  DATA:  Lab Results  Component Value Date   WBC 6.3 05/04/2014   HGB 14.8 05/04/2014   HCT 44.7 05/04/2014   MCV 97.7 05/04/2014   PLT 376 05/04/2014   Lab Results  Component Value Date   NA 141 05/04/2014   K 4.5 05/04/2014   CL 107 03/18/2011   CO2 25 05/04/2014   Lab Results  Component Value Date   ALT 10 05/04/2014   AST 17 05/04/2014   ALKPHOS 61 05/04/2014   BILITOT 0.80 05/04/2014      IMPRESSION: Clinical stage I A (T1 N0 M0) invasive ductal carcinoma of the right breast.  We discussed local treatment options which include mastectomy versus partial mastectomy followed by radiation therapy.  She may be a candidate for hypofractionated radiation therapy depending on her field separation.  She is large breasted and may require standard fractionation.  We discussed the potential acute and late toxicities of radiation therapy.  Her prognosis appears to be favorable.  She may undergo Oncotype DX testing depending on her final pathology.   PLAN: As discussed above.  I can see her back for a follow-up visit after her definitive surgery.   I spent 30  minutes face to face with the patient and more than 50% of that time was spent in counseling and/or coordination of care.

## 2014-05-04 NOTE — Patient Instructions (Signed)

## 2014-05-04 NOTE — Telephone Encounter (Signed)
per pof to sch pt appt-cld & adv pt of appt time * date-adv [facility] will call to sch MRI-pt understood

## 2014-05-05 ENCOUNTER — Other Ambulatory Visit: Payer: PRIVATE HEALTH INSURANCE

## 2014-05-05 ENCOUNTER — Ambulatory Visit (HOSPITAL_BASED_OUTPATIENT_CLINIC_OR_DEPARTMENT_OTHER): Payer: PRIVATE HEALTH INSURANCE | Admitting: Genetic Counselor

## 2014-05-05 ENCOUNTER — Encounter: Payer: Self-pay | Admitting: Genetic Counselor

## 2014-05-05 DIAGNOSIS — Z800 Family history of malignant neoplasm of digestive organs: Secondary | ICD-10-CM

## 2014-05-05 DIAGNOSIS — Z315 Encounter for genetic counseling: Secondary | ICD-10-CM

## 2014-05-05 DIAGNOSIS — Z801 Family history of malignant neoplasm of trachea, bronchus and lung: Secondary | ICD-10-CM

## 2014-05-05 DIAGNOSIS — C50411 Malignant neoplasm of upper-outer quadrant of right female breast: Secondary | ICD-10-CM

## 2014-05-05 NOTE — Progress Notes (Signed)
Patient Name: Brooke Ferguson Patient Age: 44 y.o. Encounter Date: 05/05/2014  Referring Physician: Lurline Del, MD  Primary Care Provider: Wonda Cerise, MD   Ms. Brooke Ferguson, a 45 y.o. female, is being seen at the [facility] due to a personal history of breast cancer. She presents to clinic today to discuss the possibility of a hereditary predisposition to cancer and discuss whether genetic testing is warranted.  HISTORY OF PRESENT ILLNESS: [name] was recently diagnosed with right breast cancer (IDC) at the age of 74. She stated that she is planning on proceeding with lumpectomy and that genetic testing is not a factor in her surgical decision making.  The breast tumor was ER positive, PR positive, and HER2 negative.    Breast cancer of upper-outer quadrant of right female breast   04/22/2014 Receptors her2 Estrogen Receptor: 100%, POSITIVE, STRONG STAINING INTENSITY Progesterone Receptor: 97%, POSITIVE, STRONG STAINING   HER-2/NEU BY CISH - NEGATIVE    04/22/2014 Pathology Results INVASIVE DUCTAL CARCINOMA, GRADE I, INVOLVING MULTIPLE CORES, APPROXIMATELY 5 MM IN MAXIMUM LINEAR EXTENT IN ANY ONE CORE. - CALCIFICATIONS IDENTIFIED. - NO DUCTAL CARCINOMA IN SITU PRESENT.   04/26/2014 Initial Diagnosis Breast cancer of upper-outer quadrant of right female breast   05/02/2014 Breast MRI Biopsy proven malignancy in the upper-outer quadrant of the right breast measures approximately 6 mm greatest diameter on MRI. No additional areas of concern are seen in the right breast. 2. No MRI evidence of malignancy in the left breast. 3. Innumer    Past Medical History  Diagnosis Date  . Bulging disc     BETWEEN C6-7, HAS HAD 2 CORTISONE INJECTIONS  . PONV (postoperative nausea and vomiting) 10/2010    nausea  . 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 Breast cancer of upper-outer quadrant of right female breast 04/26/2014    Past Surgical History  Procedure  Laterality Date  . Hysteroscopy with resectoscope  10/25/2010    Procedure: HYSTEROSCOPY WITH RESECTOSCOPE;  Surgeon: Lovenia Kim, MD;  Location: [facility];  Service: Gynecology;  Laterality: N/A;  Polypectomy  . Wisdom tooth extraction    . Dilation and curettage of uterus    . Diagnostic laparoscopy      OVARIAN CYCTS AT AGE 78 YRS    History   Social History  . Marital Status: Married    Spouse Name: N/A  . Number of Children: N/A  . Years of Education: N/A   Social History Main Topics  . Smoking status: Never Smoker   . Smokeless tobacco: Never Used  . Alcohol Use: Yes     Comment: SOCIALLY  . Drug Use: No  . Sexual Activity: Yes    Birth Control/ Protection: Other-see comments, None   Other Topics Concern  . Not on file   Social History Narrative     FAMILY HISTORY:   During the visit, a 4-generation pedigree was obtained. Family tree will be sent for scanning and will be in EPIC under the Media tab.  Significant diagnoses include the following:  Family History  Problem Relation Age of Onset  . Cancer Mother     astrocytoma; deceased 77  . Cancer Maternal Grandmother     lung; smoker; currently 72  . Cancer Other     colon; mat grandmother's sister  . Cancer Other     esophagus; mat grandmother's sister    Additionally, Ms. Mirkin has no children and no full siblings. She has  a maternal half-sister (age 58) who has three sons.  She has no information about her father and his family as he was estranged from her when she was very young.  Ms. Bolte ancestry is Brooke Ferguson. There is no known Jewish ancestry and no consanguinity.  ASSESSMENT AND PLAN: Ms. Brooke Ferguson is a 44 y.o. female with a personal history of breast cancer. This history is not highly suggestive of a hereditary predisposition to cancer, but testing is recommended given her age and lack of information about her paternal relatives. We reviewed the characteristics, features and inheritance patterns  of hereditary cancer syndromes. We also discussed genetic testing, including the process of testing, insurance coverage and implications of results. A negative result will be reassuring.   Ms. Brooke Ferguson wished to pursue genetic testing and a blood sample will be sent to [facility] for analysis of the 24 genes on the OvaNext gene panel (ATM, BARD1, BRCA1, BRCA2, BRIP1, CDH1, CHEK2, EPCAM, MLH1, MRE11A, MSH2, MSH6, MUTYH, NBN, NF1, PALB2, PMS2, PTEN, RAD50, RAD51C, RAD51D, SMARCA4, STK11, and TP53). This panel was selected to include the Lynch-associated genes given her mother's reported astrocytoma. We discussed the implications of a positive, negative and/ or Variant of Uncertain Significance (VUS) result. Results should be available in approximately 4-5 weeks, at which point we will contact her and address implications for her as well as address genetic testing for at-risk family members, if needed.    We encouraged Ms. Brooke Ferguson to remain in contact with Cancer Genetics annually so that we can update the family history and inform her of any changes in cancer genetics and testing that may be of benefit for this family. Ms.  Warning questions were answered to her satisfaction today.   Thank you for the referral and allowing Brooke Ferguson to share in the care of your patient.   The patient was seen for a total of 30 minutes, greater than 50% of which was spent face-to-face counseling. This patient was discussed with the overseeing provider who agrees with the above.   Steele Berg, MS, [place] Certified Genetic Counselor phone: [phone] [email or web address]

## 2014-05-06 ENCOUNTER — Ambulatory Visit
Admission: RE | Admit: 2014-05-06 | Discharge: 2014-05-06 | Disposition: A | Discharge status: Home / Self Care | Payer: PRIVATE HEALTH INSURANCE | Source: Ambulatory Visit | Attending: Radiation Oncology | Admitting: Radiation Oncology

## 2014-05-06 DIAGNOSIS — C50911 Malignant neoplasm of unspecified site of right female breast: Secondary | ICD-10-CM

## 2014-05-10 ENCOUNTER — Telehealth: Payer: Self-pay | Admitting: *Deleted

## 2014-05-10 ENCOUNTER — Other Ambulatory Visit: Payer: Self-pay | Admitting: Surgery

## 2014-05-10 ENCOUNTER — Encounter (HOSPITAL_BASED_OUTPATIENT_CLINIC_OR_DEPARTMENT_OTHER): Payer: Self-pay | Admitting: *Deleted

## 2014-05-10 DIAGNOSIS — C50911 Malignant neoplasm of unspecified site of right female breast: Secondary | ICD-10-CM

## 2014-05-10 NOTE — Telephone Encounter (Signed)
Spoke to pt concerning Soldier from 05/04/14. Pt denies questions or concerns regarding dx or treatment care plan. Confirmed surgery date and future appts. Informed pt that she will be called from Dr. Charlton Amor to schedule f/u appt with him. Encourage pt to call with needs. Received verbal understanding. Contact information given.

## 2014-05-16 ENCOUNTER — Ambulatory Visit
Admission: RE | Admit: 2014-05-16 | Discharge: 2014-05-16 | Disposition: A | Discharge status: Home / Self Care | Payer: PRIVATE HEALTH INSURANCE | Source: Ambulatory Visit | Attending: Surgery | Admitting: Surgery

## 2014-05-16 DIAGNOSIS — C50911 Malignant neoplasm of unspecified site of right female breast: Secondary | ICD-10-CM

## 2014-05-17 ENCOUNTER — Ambulatory Visit
Admission: RE | Admit: 2014-05-17 | Discharge: 2014-05-17 | Disposition: A | Discharge status: Home / Self Care | Payer: PRIVATE HEALTH INSURANCE | Source: Ambulatory Visit | Attending: Surgery | Admitting: Surgery

## 2014-05-17 ENCOUNTER — Encounter (HOSPITAL_COMMUNITY)
Admission: RE | Admit: 2014-05-17 | Discharge: 2014-05-17 | Disposition: A | Discharge status: Home / Self Care | Payer: PRIVATE HEALTH INSURANCE | Source: Ambulatory Visit | Attending: Surgery | Admitting: Surgery

## 2014-05-17 ENCOUNTER — Encounter (HOSPITAL_BASED_OUTPATIENT_CLINIC_OR_DEPARTMENT_OTHER): Payer: Self-pay | Admitting: Certified Registered"

## 2014-05-17 ENCOUNTER — Ambulatory Visit (HOSPITAL_BASED_OUTPATIENT_CLINIC_OR_DEPARTMENT_OTHER): Payer: PRIVATE HEALTH INSURANCE | Admitting: Certified Registered"

## 2014-05-17 ENCOUNTER — Encounter (HOSPITAL_BASED_OUTPATIENT_CLINIC_OR_DEPARTMENT_OTHER)
Admission: RE | Disposition: A | Discharge status: Home / Self Care | Payer: Self-pay | Source: Ambulatory Visit | Attending: Surgery

## 2014-05-17 ENCOUNTER — Ambulatory Visit (HOSPITAL_BASED_OUTPATIENT_CLINIC_OR_DEPARTMENT_OTHER)
Admission: RE | Admit: 2014-05-17 | Discharge: 2014-05-17 | Disposition: A | Discharge status: Home / Self Care | Payer: PRIVATE HEALTH INSURANCE | Source: Ambulatory Visit | Attending: Surgery | Admitting: Surgery

## 2014-05-17 DIAGNOSIS — R61 Generalized hyperhidrosis: Secondary | ICD-10-CM | POA: Diagnosis not present

## 2014-05-17 DIAGNOSIS — Z171 Estrogen receptor negative status [ER-]: Secondary | ICD-10-CM | POA: Diagnosis not present

## 2014-05-17 DIAGNOSIS — M1990 Unspecified osteoarthritis, unspecified site: Secondary | ICD-10-CM | POA: Insufficient documentation

## 2014-05-17 DIAGNOSIS — Z833 Family history of diabetes mellitus: Secondary | ICD-10-CM | POA: Insufficient documentation

## 2014-05-17 DIAGNOSIS — C50911 Malignant neoplasm of unspecified site of right female breast: Secondary | ICD-10-CM | POA: Diagnosis not present

## 2014-05-17 DIAGNOSIS — Z9889 Other specified postprocedural states: Secondary | ICD-10-CM

## 2014-05-17 DIAGNOSIS — K7689 Other specified diseases of liver: Secondary | ICD-10-CM | POA: Diagnosis not present

## 2014-05-17 DIAGNOSIS — F419 Anxiety disorder, unspecified: Secondary | ICD-10-CM | POA: Insufficient documentation

## 2014-05-17 DIAGNOSIS — R21 Rash and other nonspecific skin eruption: Secondary | ICD-10-CM | POA: Diagnosis not present

## 2014-05-17 DIAGNOSIS — N63 Unspecified lump in breast: Secondary | ICD-10-CM | POA: Diagnosis not present

## 2014-05-17 DIAGNOSIS — K649 Unspecified hemorrhoids: Secondary | ICD-10-CM | POA: Insufficient documentation

## 2014-05-17 HISTORY — DX: Other specified postprocedural states: Z98.890

## 2014-05-17 SURGERY — BREAST LUMPECTOMY WITH RADIOACTIVE SEED AND SENTINEL LYMPH NODE BIOPSY
Anesthesia: Regional | Laterality: Right

## 2014-05-17 MED ORDER — KETOROLAC TROMETHAMINE 30 MG/ML IJ SOLN
INTRAMUSCULAR | Status: AC
  Filled 2014-05-17: qty 1

## 2014-05-17 MED ORDER — SODIUM CHLORIDE 0.9 % IJ SOLN
INTRAMUSCULAR | Status: AC
  Filled 2014-05-17: qty 10

## 2014-05-17 MED ORDER — FENTANYL CITRATE 0.05 MG/ML IJ SOLN
50.0000 ug | INTRAMUSCULAR | Status: DC | PRN
  Administered 2014-05-17 (×3): 50 ug via INTRAVENOUS

## 2014-05-17 MED ORDER — PROPOFOL 10 MG/ML IV BOLUS
INTRAVENOUS | Status: DC | PRN
  Administered 2014-05-17: 200 mg via INTRAVENOUS

## 2014-05-17 MED ORDER — FENTANYL CITRATE 0.05 MG/ML IJ SOLN
INTRAMUSCULAR | Status: AC
  Filled 2014-05-17: qty 2

## 2014-05-17 MED ORDER — OXYCODONE HCL 5 MG PO TABS
5.0000 mg | ORAL_TABLET | Freq: Once | ORAL | Status: AC | PRN
  Administered 2014-05-17: 5 mg via ORAL

## 2014-05-17 MED ORDER — MEPERIDINE HCL 25 MG/ML IJ SOLN: 6.2500 mg | INTRAMUSCULAR | Status: DC | PRN

## 2014-05-17 MED ORDER — OXYCODONE HCL 5 MG PO TABS
ORAL_TABLET | ORAL | Status: AC
  Filled 2014-05-17: qty 1

## 2014-05-17 MED ORDER — ONDANSETRON HCL 4 MG/2ML IJ SOLN
INTRAMUSCULAR | Status: DC | PRN
  Administered 2014-05-17: 4 mg via INTRAVENOUS

## 2014-05-17 MED ORDER — DEXAMETHASONE SODIUM PHOSPHATE 4 MG/ML IJ SOLN
INTRAMUSCULAR | Status: DC | PRN
  Administered 2014-05-17: 10 mg via INTRAVENOUS

## 2014-05-17 MED ORDER — MIDAZOLAM HCL 2 MG/2ML IJ SOLN
INTRAMUSCULAR | Status: AC
  Filled 2014-05-17: qty 2

## 2014-05-17 MED ORDER — CHLORHEXIDINE GLUCONATE 4 % EX LIQD
1.0000 | Freq: Once | CUTANEOUS | Status: DC
Start: 2014-05-17 — End: 2014-05-17

## 2014-05-17 MED ORDER — LACTATED RINGERS IV SOLN
INTRAVENOUS | Status: DC
Start: 2014-05-17 — End: 2014-05-17
  Administered 2014-05-17: 10 mL/h via INTRAVENOUS
  Administered 2014-05-17: 11:00:00 via INTRAVENOUS

## 2014-05-17 MED ORDER — FENTANYL CITRATE 0.05 MG/ML IJ SOLN
INTRAMUSCULAR | Status: DC | PRN
  Administered 2014-05-17: 50 ug via INTRAVENOUS

## 2014-05-17 MED ORDER — BUPIVACAINE-EPINEPHRINE (PF) 0.25% -1:200000 IJ SOLN
INTRAMUSCULAR | Status: AC
  Filled 2014-05-17: qty 120

## 2014-05-17 MED ORDER — MIDAZOLAM HCL 5 MG/5ML IJ SOLN
INTRAMUSCULAR | Status: DC | PRN
  Administered 2014-05-17: 2 mg via INTRAVENOUS

## 2014-05-17 MED ORDER — HYDROMORPHONE HCL 1 MG/ML IJ SOLN
0.2500 mg | INTRAMUSCULAR | Status: DC | PRN
  Administered 2014-05-17 (×2): 0.5 mg via INTRAVENOUS

## 2014-05-17 MED ORDER — CEFAZOLIN SODIUM-DEXTROSE 2-3 GM-% IV SOLR
INTRAVENOUS | Status: AC
Start: 2014-05-17 — End: 2014-05-17
  Filled 2014-05-17: qty 50

## 2014-05-17 MED ORDER — TECHNETIUM TC 99M SULFUR COLLOID FILTERED
1.0000 | Freq: Once | INTRAVENOUS | Status: AC | PRN
  Administered 2014-05-17: 1 via INTRADERMAL

## 2014-05-17 MED ORDER — PROMETHAZINE HCL 12.5 MG PO TABS: 12.5000 mg | ORAL_TABLET | Freq: Four times a day (QID) | ORAL | Status: DC | PRN

## 2014-05-17 MED ORDER — LIDOCAINE HCL (CARDIAC) 20 MG/ML IV SOLN
INTRAVENOUS | Status: DC | PRN
  Administered 2014-05-17: 30 mg via INTRAVENOUS

## 2014-05-17 MED ORDER — KETOROLAC TROMETHAMINE 30 MG/ML IJ SOLN
30.0000 mg | Freq: Once | INTRAMUSCULAR | Status: AC | PRN
  Administered 2014-05-17: 30 mg via INTRAVENOUS

## 2014-05-17 MED ORDER — OXYCODONE-ACETAMINOPHEN 5-325 MG PO TABS: 1.0000 | ORAL_TABLET | ORAL | Status: DC | PRN

## 2014-05-17 MED ORDER — OXYCODONE HCL 5 MG/5ML PO SOLN: 5.0000 mg | Freq: Once | ORAL | Status: AC | PRN

## 2014-05-17 MED ORDER — BUPIVACAINE-EPINEPHRINE (PF) 0.5% -1:200000 IJ SOLN
INTRAMUSCULAR | Status: DC | PRN
  Administered 2014-05-17: 30 mL

## 2014-05-17 MED ORDER — BUPIVACAINE-EPINEPHRINE (PF) 0.25% -1:200000 IJ SOLN
INTRAMUSCULAR | Status: DC | PRN
  Administered 2014-05-17: 10 mL

## 2014-05-17 MED ORDER — CEFAZOLIN SODIUM-DEXTROSE 2-3 GM-% IV SOLR
2.0000 g | INTRAVENOUS | Status: AC
  Administered 2014-05-17: 2 g via INTRAVENOUS

## 2014-05-17 MED ORDER — METHYLENE BLUE 1 % INJ SOLN
INTRAMUSCULAR | Status: AC
  Filled 2014-05-17: qty 10

## 2014-05-17 MED ORDER — PROMETHAZINE HCL 25 MG/ML IJ SOLN: 6.2500 mg | INTRAMUSCULAR | Status: DC | PRN

## 2014-05-17 MED ORDER — MIDAZOLAM HCL 2 MG/2ML IJ SOLN
1.0000 mg | INTRAMUSCULAR | Status: DC | PRN
  Administered 2014-05-17: 2 mg via INTRAVENOUS
  Administered 2014-05-17: 1 mg via INTRAVENOUS

## 2014-05-17 MED ORDER — HYDROMORPHONE HCL 1 MG/ML IJ SOLN
INTRAMUSCULAR | Status: AC
  Filled 2014-05-17: qty 1

## 2014-05-17 MED ORDER — FENTANYL CITRATE 0.05 MG/ML IJ SOLN
INTRAMUSCULAR | Status: AC
  Filled 2014-05-17: qty 6

## 2014-05-17 SURGICAL SUPPLY — 45 items
APPLIER CLIP 9.375 MED OPEN (MISCELLANEOUS) ×2
BINDER BREAST LRG (GAUZE/BANDAGES/DRESSINGS) IMPLANT
BINDER BREAST MEDIUM (GAUZE/BANDAGES/DRESSINGS) IMPLANT
BINDER BREAST XLRG (GAUZE/BANDAGES/DRESSINGS) ×2 IMPLANT
BINDER BREAST XXLRG (GAUZE/BANDAGES/DRESSINGS) IMPLANT
BLADE SURG 15 STRL LF DISP TIS (BLADE) ×1 IMPLANT
BLADE SURG 15 STRL SS (BLADE) ×1
CANISTER SUCT 1200ML W/VALVE (MISCELLANEOUS) ×2 IMPLANT
CHLORAPREP W/TINT 26ML (MISCELLANEOUS) ×2 IMPLANT
CLIP APPLIE 9.375 MED OPEN (MISCELLANEOUS) ×1 IMPLANT
COVER BACK TABLE 60X90IN (DRAPES) ×2 IMPLANT
COVER MAYO STAND STRL (DRAPES) ×2 IMPLANT
COVER PROBE W GEL 5X96 (DRAPES) ×2 IMPLANT
DECANTER SPIKE VIAL GLASS SM (MISCELLANEOUS) IMPLANT
DRAPE LAPAROSCOPIC ABDOMINAL (DRAPES) ×2 IMPLANT
DRAPE UTILITY XL STRL (DRAPES) ×2 IMPLANT
ELECT COATED BLADE 2.86 ST (ELECTRODE) ×2 IMPLANT
ELECT REM PT RETURN 9FT ADLT (ELECTROSURGICAL) ×2
ELECTRODE REM PT RTRN 9FT ADLT (ELECTROSURGICAL) ×1 IMPLANT
GLOVE BIOGEL PI IND STRL 7.0 (GLOVE) ×2 IMPLANT
GLOVE BIOGEL PI IND STRL 8 (GLOVE) ×1 IMPLANT
GLOVE BIOGEL PI INDICATOR 7.0 (GLOVE) ×2
GLOVE BIOGEL PI INDICATOR 8 (GLOVE) ×1
GLOVE ECLIPSE 6.5 STRL STRAW (GLOVE) ×2 IMPLANT
GLOVE ECLIPSE 8.0 STRL XLNG CF (GLOVE) ×2 IMPLANT
GOWN STRL REUS W/ TWL LRG LVL3 (GOWN DISPOSABLE) ×2 IMPLANT
GOWN STRL REUS W/TWL LRG LVL3 (GOWN DISPOSABLE) ×2
HEMOSTAT SNOW SURGICEL 2X4 (HEMOSTASIS) ×2 IMPLANT
KIT MARKER MARGIN INK (KITS) ×2 IMPLANT
LIQUID BAND (GAUZE/BANDAGES/DRESSINGS) ×2 IMPLANT
NDL SAFETY ECLIPSE 18X1.5 (NEEDLE) IMPLANT
NEEDLE HYPO 18GX1.5 SHARP (NEEDLE)
NEEDLE HYPO 25X1 1.5 SAFETY (NEEDLE) ×2 IMPLANT
NS IRRIG 1000ML POUR BTL (IV SOLUTION) ×2 IMPLANT
PACK BASIN DAY SURGERY FS (CUSTOM PROCEDURE TRAY) ×2 IMPLANT
PENCIL BUTTON HOLSTER BLD 10FT (ELECTRODE) ×2 IMPLANT
SLEEVE SCD COMPRESS KNEE MED (MISCELLANEOUS) ×2 IMPLANT
SPONGE LAP 4X18 X RAY DECT (DISPOSABLE) ×2 IMPLANT
SUT MNCRL AB 4-0 PS2 18 (SUTURE) ×2 IMPLANT
SUT VICRYL 3-0 CR8 SH (SUTURE) ×2 IMPLANT
SYR CONTROL 10ML LL (SYRINGE) ×2 IMPLANT
TOWEL OR 17X24 6PK STRL BLUE (TOWEL DISPOSABLE) ×2 IMPLANT
TOWEL OR NON WOVEN STRL DISP B (DISPOSABLE) ×2 IMPLANT
TUBE CONNECTING 20X1/4 (TUBING) ×2 IMPLANT
YANKAUER SUCT BULB TIP NO VENT (SUCTIONS) ×2 IMPLANT

## 2014-05-17 NOTE — Progress Notes (Signed)
Assisted Dr. Germeroth with right, ultrasound guided, pectoralis block. Side rails up, monitors on throughout procedure. See vital signs in flow sheet. Tolerated Procedure well. 

## 2014-05-17 NOTE — Progress Notes (Signed)
Assisted nuc med tech [phone] with nuc med inj. Side rails up, monitors on throughout procedure. See vital signs in flow sheet. Tolerated Procedure well.

## 2014-05-17 NOTE — Interval H&P Note (Signed)
History and Physical Interval Note:  05/17/2014 10:58 AM  Brooke Ferguson  has presented today for surgery, with the diagnosis of Right Breast Cancer  The various methods of treatment have been discussed with the patient and family. After consideration of risks, benefits and other options for treatment, the patient has consented to  Procedure(s): RIGHT BREAST LUMPECTOMY WITH RADIOACTIVE SEED AND SENTINEL LYMPH NODE MAPPING (Right) as a surgical intervention .  The patient's history has been reviewed, patient examined, no change in status, stable for surgery.  I have reviewed the patient's chart and labs.  Questions were answered to the patient's satisfaction.     Keirstyn Aydt A.

## 2014-05-17 NOTE — Brief Op Note (Signed)
05/17/2014  11:51 AM  PATIENT:  Brooke Ferguson  44 y.o. female  PRE-OPERATIVE DIAGNOSIS:  Right Breast Cancer  POST-OPERATIVE DIAGNOSIS:  Right Breast Cancer  PROCEDURE:  Procedure(s): RIGHT BREAST LUMPECTOMY WITH RADIOACTIVE SEED AND SENTINEL LYMPH NODE MAPPING (Right)  SURGEON:  Surgeon(s) and Role:    * Erroll Luna, MD - Primary      ANESTHESIA:   local, regional and general  EBL:  Total I/O In: 1300 [I.V.:1300] Out: -   BLOOD ADMINISTERED:none  DRAINS: none   LOCAL MEDICATIONS USED:  BUPIVICAINE   SPECIMEN:  Source of Specimen:  right breast and axilla  DISPOSITION OF SPECIMEN:  PATHOLOGY  COUNTS:  YES  TOURNIQUET:  * No tourniquets in log *  DICTATION: .Other Dictation: Dictation Number [id number]  PLAN OF CARE: Discharge to home after PACU  PATIENT DISPOSITION:  PACU - hemodynamically stable.   Delay start of Pharmacological VTE agent (>24hrs) due to surgical blood loss or risk of bleeding: no

## 2014-05-17 NOTE — Anesthesia Procedure Notes (Addendum)
Procedure Name: LMA Insertion Date/Time: 05/17/2014 11:04 AM Performed by: BLOCKER, TIMOTHY D Pre-anesthesia Checklist: Patient identified, Emergency Drugs available, Suction available and Patient being monitored Patient Re-evaluated:Patient Re-evaluated prior to inductionOxygen Delivery Method: Circle System Utilized Preoxygenation: Pre-oxygenation with 100% oxygen Intubation Type: IV induction Ventilation: Mask ventilation without difficulty LMA: LMA inserted LMA Size: 4.0 Number of attempts: 1 Airway Equipment and Method: Bite block Placement Confirmation: positive ETCO2 Tube secured with: Tape Dental Injury: Teeth and Oropharynx as per pre-operative assessment    Anesthesia Regional Block:  Pectoralis block  Pre-Anesthetic Checklist: ,, timeout performed, Correct Patient, Correct Site, Correct Laterality, Correct Procedure, Correct Position, site marked, Risks and benefits discussed,  Surgical consent,  Pre-op evaluation,  At surgeon's request and post-op pain management  Laterality: Right  Prep: chloraprep       Needles:  Injection technique: Single-shot  Needle Type: Stimiplex     Needle Length: 9cm 9 cm Needle Gauge: 22 and 22 G    Additional Needles:  Procedures: ultrasound guided (picture in chart) Pectoralis block Narrative:  Injection made incrementally with aspirations every 5 mL.  Performed by: Personally  Anesthesiologist: Nolon Nations  Additional Notes: Good muscle plane spread. Patient tolerated the procedure well without complications

## 2014-05-17 NOTE — H&P (View-Only) (Signed)
Brooke Ferguson 05/04/2014 8:17 AM Location: [facility] Patient #: [id number] DOB: 11-29-1970 Undefined / Language: Brooke Ferguson / Race: Undefined Female History of Present Illness [name] A. Lavergne Hiltunen MD; 05/04/2014 3:23 PM) Patient words: RIGHT BREAST CANCER   pt sent at the request of dr [name] for right breast cancer found on mammogram. pt denies pain or nipple discharge. no family hx of breast cancer. no mass or pain.      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  LABS: None performed  EXAM: BILATERAL BREAST MRI WITH AND WITHOUT CONTRAST  TECHNIQUE: Multiplanar, multisequence MR images of both breasts were obtained prior to and following the intravenous administration of 15 ml of Multihance.  THREE-DIMENSIONAL MR IMAGE RENDERING ON INDEPENDENT WORKSTATION:  Three-dimensional MR images were rendered by post-processing of the original MR data on an independent workstation. The three-dimensional MR images were interpreted, and findings are reported in the following complete MRI report for this study. Three dimensional images were evaluated at the independent DynaCad workstation  COMPARISON: Previous exam(s) performed at the [facility] including diagnostic right mammogram a right breast ultrasound dated 04/12/2014, report firm right breast stereotactic core needle biopsy dated 04/22/2014, post biopsy mammogram of the right breast 04/22/2014, and the left diagnostic mammogram 04/25/2014.  FINDINGS: Breast composition: b. Scattered fibroglandular tissue.  Background parenchymal enhancement: Moderate  Right breast: There is a 6 mm slightly irregular enhancing mass with washout kinetics in the far superior right breast, upper outer quadrant, middle third in the anterior to posterior Biopsy clip artifact is along the lateral margin of the mass. There is  an adjacent biopsy tract demonstrating slight enhancement. No additional suspicious masses or suspicious enhancement is identified in the right breast.  Left breast: No mass or abnormal enhancement.  Lymph nodes: No abnormal appearing lymph nodes.  Ancillary findings: In the incidentally imaged portion of the upper abdomen, there are innumerable hepatic cysts. Additionally, there are innumerable cysts within the imaged portion of the left upper quadrant; on these images, the origin of the left upper quadrant cysts is uncertain. Imaged cysts measure up to 6 cm in size.  IMPRESSION: 1. Biopsy proven malignancy in the upper-outer quadrant of the right breast measures approximately 6 mm greatest diameter on MRI. No additional areas of concern are seen in the right breast. 2. No MRI evidence of malignancy in the left breast. 3. Innumerable hepatic and left upper quadrant abdominal cysts. No prior abdominal imaging is available. Question any family history of polycystic kidney disease? Abdominal MRI with without contrast is recommended for further evaluation.  RECOMMENDATION: 1. Treatment planning for biopsy proven malignancy in the right breast. 2. Abdominal MRI with and without contrast should be considered for evaluation of innumerable, partially imaged upper abdominal cysts.  BI-RADS CATEGORY 6: Known biopsy-proven malignancy.   Electronically Signed By: [name] M.D. On: 05/02/2014 11:43   CLINICAL DATA: Mass posterior upper-outer quadrant right breast  EXAM: DIAGNOSTIC RIGHT MAMMOGRAM POST STEREOTACTIC BIOPSY  COMPARISON: Previous exam(s).  FINDINGS: Mammographic images were obtained following stereotactic guided biopsy of a mass posteriorly in the right upper outer quadrant. Marker clip projects over the mass appropriately.  IMPRESSION: Appropriate clip position  Final Assessment: Post Procedure Mammograms for Marker Placement   Electronically  Signed By: [name] M.D. On: 04/22/2014 11:21    ADDITIONAL INFORMATION: CHROMOGENIC IN-SITU HYBRIDIZATION Results: HER-2/NEU BY CISH - NEGATIVE. RESULT RATIO OF HER2: CEP 17 SIGNALS 1.21 AVERAGE HER2 COPY NUMBER  PER CELL 2.00 REFERENCE RANGE NEGATIVE HER2/Chr17 Ratio <2.0 and Average HER2 copy number <4.0 EQUIVOCAL HER2/Chr17 Ratio <2.0 and Average HER2 copy number 4.0 and <6.0 POSITIVE HER2/Chr17 Ratio >=2.0 and/or Average HER2 copy number >=6.0 [name] MD Pathologist, Electronic Signature ( Signed 04/28/2014) PROGNOSTIC INDICATORS - ACIS Results: IMMUNOHISTOCHEMICAL AND MORPHOMETRIC ANALYSIS BY THE AUTOMATED CELLULAR IMAGING SYSTEM (ACIS) Estrogen Receptor: 100%, POSITIVE, STRONG STAINING INTENSITY Progesterone Receptor: 97%, POSITIVE, STRONG STAINING INTENSITY Proliferation Marker Ki67: 12% REFERENCE RANGE ESTROGEN RECEPTOR NEGATIVE <1% POSITIVE =>1% PROGESTERONE RECEPTOR NEGATIVE <1% 1 of 3 FINAL for Hackman, Keeana ([id number]) ADDITIONAL INFORMATION:(continued) POSITIVE =>1% All controls stained appropriately [name] MD Pathologist, Electronic Signature ( Signed 04/27/2014) FINAL DIAGNOSIS Diagnosis Breast, right, needle core biopsy, mass, UOQ - INVASIVE DUCTAL CARCINOMA, GRADE I, INVOLVING MULTIPLE CORES, APPROXIMATELY 5 MM IN MAXIMUM LINEAR EXTENT IN ANY ONE CORE. - CALCIFICATIONS IDENTIFIED. - NO DUCTAL CARCINOMA IN SITU PRESENT. - BREAST PROGNOSTIC MARKERS WILL BE ORDERED ON BLOCK 1C AND SEPARATELY REPORTED. - SEE COMMENT. Microscopic Comment COMMENT: Internal departmental review obtained (Dr. Tresa Moore) with agreement. Results are phoned to [place] (04/25/14). (MEG:ds 04/25/14) [name] MD Pathologist, Electronic Signature (Case signed 04/25/2014) Specimen Gross and Clinical Information Specimen Comment Mass, 5 mm Specimen(s) Obtained: Breast, right, needle core biopsy, mass, UOQ Specimen  Clinical Information Lymph node? CA? Gross Received in formalin are 12 cores of soft, tan-red tissue which measure 0.6 x 0.3 x 0.1 cm and up to 5.7 x 0.4 x 0.3 cm. Also included are similar appearing soft tissue fragments which measures 1.8 x 1.0 x 0.5 cm in aggregate. Submitted in toto in 4 block(s) Time in Formalin 11:16 am Stain(s) used in Diagnosis: The following stain(s) were used in diagnosing the case: ER-ACIS, PR-ACIS, CISH, KI-67-ACIS. The control(s) stained appropriately. 2 of 3 FINAL for Pons, Jerrye ([id number]) Disclaimer Her2Neu by CISH (chromogenic in-situ hybridization) is performed at [facility], using the [place] pharmDx Kit (code number [phone]). This test is used to detect the amplification of the Her2Neu gene in interphase nuclei from formalin fixed, paraffin embedded tissue and is reported using ASCO/CAP scoring criteria published in 2013. PR progesterone receptor (PgR 636), immunohistochemical stains are performed on formalin fixed, paraffin embedded tissue using a 3,3"-diaminobenzidine (DAB) chromogen and [employer]AK[employer] Autostainer System. The staining intensity of the nucleus is scored morphometrically using the Automated Cellular Imaging System (ACIS) and is reported as the percentage of tumor cell nuclei demonstrating specific nuclear staining. Estrogen receptor (SP1), immunohistochemical stains are performed on formalin fixed, paraffin embedded tissue using a 3,3"-diaminobenzidine (DAB) chromogen and DAKO Autostainer System. The staining intensity of the nucleus is scored morphometrically using the Automated Cellular Imaging System (ACIS) and is reported as the percentage of tumor cell nuclei demonstrating specific nuclear staining. Ki-67 (Mib-1), immunohistochemical stains are performed on formalin fixed, paraffin embedded tissue using a 3,3"-diaminobenzidine (DAB) chromogen and [place]. The staining intensity of the nucleus is scored  morphometrically using the Automated Cellular Imaging System (ACIS) and is reported as the percentage of tumor cell nuclei demonstrating specific nuclear staining. Report signed out from the following location(s) Technical Component was performed at [facility]. [address],[place], [address].[id number]., Interpretation was performed at [facility].  The patient is a 44 year old female   Other Problems Anderson Malta [place], [place]; 05/04/2014 8:17 AM) Anxiety Disorder Back Pain Hemorrhoids  Past Surgical History Anderson Malta [place], RMA; 05/04/2014 8:17 AM) Breast Biopsy Right. Oral Surgery  Diagnostic Studies History (  Jeanann Lewandowsky, RMA; 05/04/2014 8:17 AM) Colonoscopy never Mammogram within last year Pap Smear 1-5 years ago  Social History Anderson Malta [place], RMA; 05/04/2014 8:17 AM) Alcohol use Moderate alcohol use. Caffeine use Coffee. No drug use Tobacco use Never smoker.  Family History Anderson Malta [place], [place]; 05/04/2014 8:17 AM) Arthritis Family Members In General. Breast Cancer Family Members In General. Cancer Family Members In General, Mother. Cerebrovascular Accident Family Members In [place] Members In General. Colon Polyps Family Members In General. Diabetes Mellitus Mother, Sister. Heart Disease Family Members In General. Respiratory Condition Family Members In General.  Pregnancy / Birth History Jeanann Lewandowsky, [place]; 05/04/2014 8:17 AM) Age at menarche 44 years. Contraceptive History Depo-provera, Oral contraceptives. Gravida 0 Regular periods     Review of Systems Anderson Malta [place] RMA; 05/04/2014 8:17 AM) General Present- Fatigue and Night Sweats. Not Present- Appetite Loss, Chills, Fever, Weight Gain and Weight Loss. Skin Present- Dryness. Not Present- Change in Wart/Mole, Hives, Jaundice, New Lesions, Non-Healing Wounds, Rash and Ulcer. HEENT Not Present- Earache,  Hearing Loss, Hoarseness, Nose Bleed, Oral Ulcers, Ringing in the Ears, Seasonal Allergies, Sinus Pain, Sore Throat, Visual Disturbances, Wears glasses/contact lenses and Yellow Eyes. Respiratory Not Present- Bloody sputum, Chronic Cough, Difficulty Breathing, Snoring and Wheezing. Breast Not Present- Breast Mass, Breast Pain, Nipple Discharge and Skin Changes. Cardiovascular Not Present- Chest Pain, Difficulty Breathing Lying Down, Leg Cramps, Palpitations, Rapid Heart Rate, Shortness of Breath and Swelling of Extremities. Gastrointestinal Not Present- Abdominal Pain, Bloating, Bloody Stool, Change in Bowel Habits, Chronic diarrhea, Constipation, Difficulty Swallowing, Excessive gas, Gets full quickly at meals, Hemorrhoids, Indigestion, Nausea, Rectal Pain and Vomiting. Female Genitourinary Not Present- Frequency, Nocturia, Painful Urination, Pelvic Pain and Urgency. Musculoskeletal Not Present- Back Pain, Joint Pain, Joint Stiffness, Muscle Pain, Muscle Weakness and Swelling of Extremities. Neurological Present- Headaches. Not Present- Decreased Memory, Fainting, Numbness, Seizures, Tingling, Tremor, Trouble walking and Weakness. Psychiatric Present- Anxiety, Change in Sleep Pattern, Fearful and Frequent crying. Not Present- Bipolar and Depression. Endocrine Not Present- Cold Intolerance, Excessive Hunger, Hair Changes, Heat Intolerance, Hot flashes and New Diabetes. Hematology Present- Easy Bruising. Not Present- Excessive bleeding, Gland problems, HIV and Persistent Infections.   Physical Exam (Sarae Nicholes A. Jemal Miskell MD; 05/04/2014 3:23 PM)  General Mental Status-Alert. General Appearance-Consistent with stated age. Hydration-Well hydrated. Voice-Normal.  Head and Neck Head-normocephalic, atraumatic with no lesions or palpable masses. Trachea-midline. Thyroid Gland Characteristics - normal size and consistency.  Eye Eyeball - Bilateral-Extraocular movements  intact. Sclera/Conjunctiva - Bilateral-No scleral icterus.  Chest and Lung Exam Chest and lung exam reveals -quiet, even and easy respiratory effort with no use of accessory muscles and on auscultation, normal breath sounds, no adventitious sounds and normal vocal resonance. Inspection Chest Wall - Normal. Back - normal.  Breast Breast - Left-Symmetric, Non Tender, No Biopsy scars, no Dimpling, No Inflammation, No Lumpectomy scars, No Mastectomy scars, No Peau d' Orange. Breast - Right-Symmetric, Non Tender, No Biopsy scars, no Dimpling, No Inflammation, No Lumpectomy scars, No Mastectomy scars, No Peau d' Orange. Breast Lump-No Palpable Breast Mass.  Cardiovascular Cardiovascular examination reveals -normal heart sounds, regular rate and rhythm with no murmurs and normal pedal pulses bilaterally.  Abdomen Inspection Inspection of the abdomen reveals - No Hernias. Skin - Scar - no surgical scars. Palpation/Percussion Palpation and Percussion of the abdomen reveal - Soft, Non Tender, No Rebound tenderness, No Rigidity (guarding) and No hepatosplenomegaly. Auscultation Auscultation of the abdomen reveals - Bowel sounds normal.  Neurologic Neurologic evaluation reveals -alert and oriented x  3 with no impairment of recent or remote memory. Mental Status-Normal.  Musculoskeletal Normal Exam - Left-Upper Extremity Strength Normal and Lower Extremity Strength Normal. Normal Exam - Right-Upper Extremity Strength Normal and Lower Extremity Strength Normal.  Lymphatic Head & Neck  General Head & Neck Lymphatics: Bilateral - Description - Normal. Axillary  General Axillary Region: Bilateral - Description - Normal. Tenderness - Non Tender. Femoral & Inguinal - Did not examine.    Assessment & Plan (Marvelous Bouwens A. Cadee Agro MD; 05/04/2014 3:24 PM)  BREAST CANCER, RIGHT (174.9  C50.911) Impression: discusse breast conservation and mastectomy with reconstruction. pt opted  for right breast lumpectomy and SLN mapping. she will see genetics as well. Risk of lumpectomy include bleeding, infection, seroma, more surgery, use of seed/wire, wound care, cosmetic deformity and the need for other treatments, death , blood clots, death. Pt agrees to proceed. Risk of sentinel lymph node mapping include bleeding, infection, lymphedema, shoulder pain. stiffness, dye allergy. cosmetic deformity , blood clots, death, need for more surgery. Pt agres to proceed.

## 2014-05-17 NOTE — Discharge Instructions (Signed)
[facility] °Office Phone Number [phone] ° °BREAST BIOPSY/ PARTIAL MASTECTOMY: POST OP INSTRUCTIONS ° °Always review your discharge instruction sheet given to you by the facility where your surgery was performed. ° °IF YOU HAVE DISABILITY OR FAMILY LEAVE FORMS, YOU MUST BRING THEM TO THE OFFICE FOR PROCESSING.  DO NOT GIVE THEM TO YOUR DOCTOR. ° °1. A prescription for pain medication may be given to you upon discharge.  Take your pain medication as prescribed, if needed.  If narcotic pain medicine is not needed, then you may take acetaminophen (Tylenol) or ibuprofen (Advil) as needed. °2. Take your usually prescribed medications unless otherwise directed °3. If you need a refill on your pain medication, please contact your pharmacy.  They will contact our office to request authorization.  Prescriptions will not be filled after 5pm or on week-ends. °4. You should eat very light the first 24 hours after surgery, such as soup, crackers, pudding, etc.  Resume your normal diet the day after surgery. °5. Most patients will experience some swelling and bruising in the breast.  Ice packs and a good support bra will help.  Swelling and bruising can take several days to resolve.  °6. It is common to experience some constipation if taking pain medication after surgery.  Increasing fluid intake and taking a stool softener will usually help or prevent this problem from occurring.  A mild laxative (Milk of Magnesia or Miralax) should be taken according to package directions if there are no bowel movements after 48 hours. °7. Unless discharge instructions indicate otherwise, you may remove your bandages 24-48 hours after surgery, and you may shower at that time.  You may have steri-strips (small skin tapes) in place directly over the incision.  These strips should be left on the skin for 7-10 days.  If your surgeon used skin glue on the incision, you may shower in 24 hours.  The glue will flake off over the  next 2-3 weeks.  Any sutures or staples will be removed at the office during your follow-up visit. °8. ACTIVITIES:  You may resume regular daily activities (gradually increasing) beginning the next day.  Wearing a good support bra or sports bra minimizes pain and swelling.  You may have sexual intercourse when it is comfortable. °a. You may drive when you no longer are taking prescription pain medication, you can comfortably wear a seatbelt, and you can safely maneuver your car and apply brakes. °b. RETURN TO WORK:  ______________________________________________________________________________________ °9. You should see your doctor in the office for a follow-up appointment approximately two weeks after your surgery.  Your doctor’s nurse will typically make your follow-up appointment when she calls you with your pathology report.  Expect your pathology report 2-3 business days after your surgery.  You may call to check if you do not hear from us after three days. °10. OTHER INSTRUCTIONS: _______________________________________________________________________________________________ _____________________________________________________________________________________________________________________________________ °_____________________________________________________________________________________________________________________________________ °_____________________________________________________________________________________________________________________________________ ° °WHEN TO CALL YOUR DOCTOR: °1. Fever over 101.0 °2. Nausea and/or vomiting. °3. Extreme swelling or bruising. °4. Continued bleeding from incision. °5. Increased pain, redness, or drainage from the incision. ° °The clinic staff is available to answer your questions during regular business hours.  Please don’t hesitate to call and ask to speak to one of the nurses for clinical concerns.  If you have a medical emergency, go to the nearest  emergency room or call 911.  A surgeon from [facility] is always on call at the hospital. ° °For further questions, please visit [email or web address]  ° ° °  Post Anesthesia Home Care Instructions ° °Activity: °Get plenty of rest for the remainder of the day. A responsible adult should stay with you for 24 hours following the procedure.  °For the next 24 hours, DO NOT: °-Drive a car °-Operate machinery °-Drink alcoholic beverages °-Take any medication unless instructed by your physician °-Make any legal decisions or sign important papers. ° °Meals: °Start with liquid foods such as gelatin or soup. Progress to regular foods as tolerated. Avoid greasy, spicy, heavy foods. If nausea and/or vomiting occur, drink only clear liquids until the nausea and/or vomiting subsides. Call your physician if vomiting continues. ° °Special Instructions/Symptoms: °Your throat may feel dry or sore from the anesthesia or the breathing tube placed in your throat during surgery. If this causes discomfort, gargle with warm salt water. The discomfort should disappear within 24 hours. ° °

## 2014-05-17 NOTE — Anesthesia Postprocedure Evaluation (Signed)
Anesthesia Post Note  Patient: Brooke Ferguson  Procedure(s) Performed: Procedure(s) (LRB): RIGHT BREAST LUMPECTOMY WITH RADIOACTIVE SEED AND SENTINEL LYMPH NODE MAPPING (Right)  Anesthesia type: General  Patient location: PACU  Post pain: Pain level controlled and Adequate analgesia  Post assessment: Post-op Vital signs reviewed, Patient's Cardiovascular Status Stable, Respiratory Function Stable, Patent Airway and Pain level controlled  Last Vitals:  Filed Vitals:   05/17/14 1325  BP:   Pulse: 80  Temp: 36.8 C  Resp: 18    Post vital signs: Reviewed and stable  Level of consciousness: awake, alert  and oriented  Complications: No apparent anesthesia complications

## 2014-05-17 NOTE — Transfer of Care (Signed)
Immediate Anesthesia Transfer of Care Note  Patient: [name]  Procedure(s) Performed: Procedure(s): RIGHT BREAST LUMPECTOMY WITH RADIOACTIVE SEED AND SENTINEL LYMPH NODE MAPPING (Right)  Patient Location: PACU  Anesthesia Type:GA combined with regional for post-op pain  Level of Consciousness: awake, alert , oriented and patient cooperative  Airway & Oxygen Therapy: Patient Spontanous Breathing and Patient connected to face mask oxygen  Post-op Assessment: Report given to RN and Post -op Vital signs reviewed and stable  Post vital signs: Reviewed and stable  Last Vitals:  Filed Vitals:   05/17/14 0953  BP:   Pulse: 93  Temp:   Resp: 18    Complications: No apparent anesthesia complications

## 2014-05-17 NOTE — Anesthesia Preprocedure Evaluation (Addendum)
Anesthesia Evaluation  Patient identified by MRN, date of birth, ID band Patient awake    Reviewed: Allergy & Precautions, NPO status , Patient's Chart, lab work & pertinent test results  Airway Mallampati: II  TM Distance: >3 FB Neck ROM: Full    Dental no notable dental hx.    Pulmonary neg pulmonary ROS,  breath sounds clear to auscultation  Pulmonary exam normal       Cardiovascular negative cardio ROS  Rhythm:Regular Rate:Normal     Neuro/Psych negative neurological ROS  negative psych ROS   GI/Hepatic negative GI ROS, Neg liver ROS,   Endo/Other  negative endocrine ROS  Renal/GU negative Renal ROS     Musculoskeletal negative musculoskeletal ROS (+) Arthritis -,   Abdominal   Peds  Hematology negative hematology ROS (+)   Anesthesia Other Findings   Reproductive/Obstetrics negative OB ROS                            Anesthesia Physical Anesthesia Plan  ASA: II  Anesthesia Plan: General and Regional   Post-op Pain Management:    Induction: Intravenous  Airway Management Planned: LMA  Additional Equipment:   Intra-op Plan:   Post-operative Plan: Extubation in OR  Informed Consent: I have reviewed the patients History and Physical, chart, labs and discussed the procedure including the risks, benefits and alternatives for the proposed anesthesia with the patient or authorized representative who has indicated his/her understanding and acceptance.   Dental advisory given  Plan Discussed with: CRNA  Anesthesia Plan Comments:         Anesthesia Quick Evaluation                                   Anesthesia Evaluation  Patient identified by MRN, date of birth, ID band Patient awake    Reviewed: Allergy & Precautions, H&P , NPO status , Patient's Chart, lab work & pertinent test results, reviewed documented beta blocker date and time   Airway Mallampati: I TM  Distance: >3 FB Neck ROM: full    Dental No notable dental hx. (+) Teeth Intact   Pulmonary neg pulmonary ROS,    Pulmonary exam normal       Cardiovascular neg cardio ROS     Neuro/Psych Negative Neurological ROS  Negative Psych ROS   GI/Hepatic negative GI ROS, Neg liver ROS,   Endo/Other  Negative Endocrine ROS  Renal/GU negative Renal ROS  Genitourinary negative   Musculoskeletal negative musculoskeletal ROS (+)   Abdominal Normal abdominal exam  (+)   Peds negative pediatric ROS (+)  Hematology negative hematology ROS (+)   Anesthesia Other Findings   Reproductive/Obstetrics negative OB ROS                           Anesthesia Physical Anesthesia Plan  ASA: I  Anesthesia Plan: General   Post-op Pain Management:    Induction: Intravenous  Airway Management Planned: LMA  Additional Equipment:   Intra-op Plan:   Post-operative Plan:   Informed Consent: I have reviewed the patients History and Physical, chart, labs and discussed the procedure including the risks, benefits and alternatives for the proposed anesthesia with the patient or authorized representative who has indicated his/her understanding and acceptance.     Plan Discussed with: CRNA  Anesthesia Plan Comments:  Anesthesia Quick Evaluation                                   Anesthesia Evaluation  Name, MR# and DOB Patient awake  General Assessment Comment  Reviewed: Allergy & Precautions, H&P , NPO status , Patient's Chart, lab work & pertinent test results, reviewed documented beta blocker date and time   Airway Mallampati: I TM Distance: >3 FB Neck ROM: full    Dental  (+) Teeth Intact   Pulmonary  clear to auscultation  breath sounds clear to auscultation none    Cardiovascular Exercise Tolerance: Good regular Normal    Neuro/Psych Negative Neurological ROS  Negative Psych ROS  GI/Hepatic/Renal negative GI  ROS  negative Liver ROS  negative Renal ROS        Endo/Other  Negative Endocrine ROS (+)      Abdominal   Musculoskeletal negative musculoskeletal ROS (+)   Hematology negative hematology ROS (+)   Peds  Reproductive/Obstetrics negative OB ROS    Anesthesia Other Findings            Anesthesia Physical Anesthesia Plan  ASA: I  Anesthesia Plan: General   Post-op Pain Management:    Induction:   Airway Management Planned: LMA  Additional Equipment:   Intra-op Plan:   Post-operative Plan:   Informed Consent: I have reviewed the patients History and Physical, chart, labs and discussed the procedure including the risks, benefits and alternatives for the proposed anesthesia with the patient or authorized representative who has indicated his/her understanding and acceptance.   Dental Advisory Given  Plan Discussed with: Surgeon and CRNA  Anesthesia Plan Comments:        Anesthesia Quick Evaluation

## 2014-05-18 ENCOUNTER — Ambulatory Visit (HOSPITAL_COMMUNITY): Payer: PRIVATE HEALTH INSURANCE

## 2014-05-18 NOTE — Op Note (Signed)
NAMESHERRISE, LIBERTO            ACCOUNT NO.:  [id number]  MEDICAL RECORD NO.:  [id number]  LOCATION:                                 FACILITY:  PHYSICIAN:  [name] A. Solstice Lastinger, M.D.DATE OF BIRTH:  07/13/1970  DATE OF PROCEDURE:  05/17/2014 DATE OF DISCHARGE:  05/17/2014                              OPERATIVE REPORT   PREOPERATIVE DIAGNOSIS:  Stage I right breast cancer.  POSTOPERATIVE DIAGNOSIS:  Stage I right breast cancer.  PROCEDURE:  Right breast seed-localized lumpectomy with right axillary sentinel lymph node mapping.  SURGEON:  [name] A. Cayton Cuevas, M.D.  ANESTHESIA:  LMA with 0.25% bupivacaine with epinephrine plus a pectoral block performed by Anesthesia.  EBL:  Minimal.  SPECIMEN: 1. Right breast mass. 2. Re-excision of all margins except posterior, which was the     pectoralis major muscle and three right axillary sentinel nodes.  DRAINS:  None.  INDICATIONS FOR PROCEDURE:  The patient is a 44 year old female with stage I right breast cancer.  We discussed options of breast conservation versus mastectomy with reconstruction.  Risk, benefits, and alternatives were discussed at the [facility].  She opted for breast conservation.  Risks were discussed of bleeding, infection, re-excision, injury to axillary structures, the need for more surgery, cosmetic deformity, and the need for other operative procedures to correct all of the above.  After discussion of both with her family and her husband and the patient, they agreed to proceed.  DESCRIPTION OF PROCEDURE:  The patient was met in the holding area with a Neoprobe was used to localize to verify seed placement and to verify technetium injection, which were both present.  Questions were answered. Right breast was marked as correct side.  The patient was taken back to the operating room at this point and placed supine on the OR table and where general anesthesia was initiated.  Of note, she  received a pectoral block by Anesthesia preop.  Upper chest region and right arm were prepped and draped in sterile fashion after induction of general anesthesia.  Time-out was done.  Neoprobe was used to verify the hot spot in the right upper outer quadrant.  Both the tumor and lymph node basin were then reasonable distance of each other.  Therefore, I felt one incision would be appropriate.  Curvilinear incision was made in the right upper outer quadrant to the breast.  Neoprobe was used to identify the seed and all tissue around the seed was excised in its entirety with the seeding clip in the specimen.  I went ahead and excised all margins except the deep margin which was a pectoralis muscle which was already removed with the specimen.  These were passed off.  These were sent separately.  Neoprobe was then changed to technetium settings and hotspot identified in the right axilla.  Three hot nodes were identified and removed from the level 1 right axillary basin.  Background counts approached 0.  Cavity was irrigated and clips were placed in the cavity. I closed the cavity of deep layer with 3-0 Vicryl and 4-0 Monocryl was used to close the skin in a subcuticular fashion.  Dermabond applied. All final counts of sponge, needle, and instruments  were found to be correct at this portion of the case.  The patient was awoke, extubated, and taken to recovery in satisfactory condition.     Ashlin Hidalgo A. Alle Difabio, M.D.     TAC/MEDQ  D:  05/17/2014  T:  05/17/2014  Job:  [id number]

## 2014-05-20 ENCOUNTER — Telehealth: Payer: Self-pay | Admitting: *Deleted

## 2014-05-20 NOTE — Telephone Encounter (Signed)
Received order for oncotype testing. Requisition sent to pathology. Received by Christy. 

## 2014-05-27 ENCOUNTER — Telehealth: Payer: Self-pay | Admitting: *Deleted

## 2014-05-27 ENCOUNTER — Encounter (HOSPITAL_COMMUNITY): Payer: Self-pay

## 2014-05-27 NOTE — Telephone Encounter (Signed)
Called pt to give oncotype score. Confirmed abd MRI and f/u appt with Drs. [name] and Magrinat Pt denies further questions or concerns.

## 2014-05-27 NOTE — Telephone Encounter (Signed)
Received oncotype score of 9/6%. Copy given to Dr. Doris Cheadle. Original to HIM for scanning.

## 2014-05-30 ENCOUNTER — Other Ambulatory Visit: Payer: Self-pay | Admitting: Oncology

## 2014-05-30 ENCOUNTER — Ambulatory Visit (HOSPITAL_COMMUNITY)
Admission: RE | Admit: 2014-05-30 | Discharge: 2014-05-30 | Disposition: A | Discharge status: Home / Self Care | Payer: No Typology Code available for payment source | Source: Ambulatory Visit | Attending: Oncology | Admitting: Oncology

## 2014-05-30 DIAGNOSIS — R160 Hepatomegaly, not elsewhere classified: Secondary | ICD-10-CM | POA: Diagnosis not present

## 2014-05-30 DIAGNOSIS — K7689 Other specified diseases of liver: Secondary | ICD-10-CM | POA: Diagnosis not present

## 2014-05-30 DIAGNOSIS — C50919 Malignant neoplasm of unspecified site of unspecified female breast: Secondary | ICD-10-CM

## 2014-05-30 DIAGNOSIS — K760 Fatty (change of) liver, not elsewhere classified: Secondary | ICD-10-CM | POA: Diagnosis not present

## 2014-05-30 DIAGNOSIS — Z853 Personal history of malignant neoplasm of breast: Secondary | ICD-10-CM | POA: Insufficient documentation

## 2014-05-30 MED ORDER — GADOBENATE DIMEGLUMINE 529 MG/ML IV SOLN
20.0000 mL | Freq: Once | INTRAVENOUS | Status: AC | PRN
  Administered 2014-05-30: 17 mL via INTRAVENOUS

## 2014-05-31 ENCOUNTER — Encounter: Payer: Self-pay | Admitting: Genetic Counselor

## 2014-05-31 DIAGNOSIS — Z1379 Encounter for other screening for genetic and chromosomal anomalies: Secondary | ICD-10-CM | POA: Insufficient documentation

## 2014-05-31 NOTE — Progress Notes (Signed)
GENETIC TEST RESULTS  Patient Name: Kripa Foskey Patient Age: 44 y.o. Encounter Date: 05/31/2014  Referring Physician: Lurline Del, MD     Ms. Bacha was called today to discuss genetic test results. Please see the Genetics note from her visit on 05/05/14 for a detailed discussion of her personal and family history.  GENETIC TESTING: At the time of Ms. Lyman's visit, we recommended she pursue genetic testing of multiple genes on the OvaNext gene panel. This test, which included sequencing and deletion/duplication analysis of 24 genes, was performed at [facility]. Testing was normal and did not reveal a mutation in these genes. The genes tested were ATM, BARD1, BRCA1, BRCA2, BRIP1, CDH1, CHEK2, EPCAM, MLH1, MRE11A, MSH2, MSH6, MUTYH, NBN, NF1, PALB2, PMS2, PTEN, RAD50, RAD51C, RAD51D, SMARCA4, STK11, and TP53.  We discussed with Ms. Bradt that since the current test is not perfect, it is possible there may be a gene mutation that current testing cannot detect, but that chance is small. We also discussed that it is possible that a different genetic factor, which was not part of this testing or has not yet been discovered, is responsible for the cancer diagnoses in the family. Should Ms. Oswald wish to discuss or pursue this additional testing, we are happy to coordinate this at any time, but do not feel that she is at significant risk of harboring a mutation in a different gene.     CANCER SCREENING: This result suggests that Ms. Edick's cancer was most likely not due to an inherited predisposition. Most cancers happen by chance and this negative test, along with details of her family history, suggests that her cancer falls into this category. We, therefore, recommended she continue to follow the cancer screening guidelines provided by her physician.   FAMILY MEMBERS: Women in the family are at some increased risk of developing breast cancer, over the general population  risk, simply due to the family history. We recommended they have a yearly mammogram beginning at age 31 which is 14 years earlier than the youngest breast cancer diagnosis, a yearly clinical breast exam, and perform monthly breast self-exams. A gynecologic exam is recommended yearly. Colon cancer screening is recommended to begin by age 30.  Lastly, we discussed with Ms. Cadden that cancer genetics is a rapidly advancing field and it is possible that new genetic tests will be appropriate for her in the future. We encouraged her to remain in contact with [place] on an annual basis so we can update her personal and family histories, and let her know of advances in cancer genetics that may benefit the family. Our contact number was provided. Ms. Muecke questions were answered to her satisfaction today, and she knows she is welcome to call anytime with additional questions.    Steele Berg, MS, [place] Certified Genetic Counselor phone: [phone] [email or web address]

## 2014-06-02 ENCOUNTER — Encounter: Payer: Self-pay | Admitting: Radiation Oncology

## 2014-06-02 NOTE — Progress Notes (Signed)
Location of Breast Cancer: Right Breast, Upper Outer Quadrant  Histology per Pathology Report:   05/17/14 Diagnosis 1. Breast, lumpectomy, right with radioactive seed - INVASIVE DUCTAL CARCINOMA, SEE COMMENT. - NEGATIVE FOR LYMPH VASCULAR INVASION. - PREVIOUS BIOPSY SITE. - SEE TUMOR SYNOPTIC TEMPLATE BELOW. 2. Breast, excision, right breast anterior margin - BENIGN BREAST TISSUE. - NEGATIVE FOR ATYPIA OR MALIGNANCY. - SURGICAL MARGIN, NEGATIVE FOR ATYPIA OR MALIGNANCY. 3. Breast, excision, right breast lateral margin - BENIGN BREAST TISSUE. - NEGATIVE FOR ATYPIA OR MALIGNANCY. - SURGICAL MARGIN, NEGATIVE FOR ATYPIA OR MALIGNANCY. 4. Breast, excision, right breast medial margin - BENIGN BREAST TISSUE. - NEGATIVE FOR ATYPIA OR MALIGNANCY. - SURGICAL MARGIN, NEGATIVE FOR ATYPIA OR MALIGNANCY. 5. Breast, excision, right breast superior margin - BENIGN BREAST TISSUE. - NEGATIVE FOR ATYPIA OR MALIGNANCY. - SURGICAL MARGIN, NEGATIVE FOR ATYPIA OR MALIGNANCY. 6. Breast, excision, right breast inferior margin - BENIGN BREAST TISSUE. - NEGATIVE FOR ATYPIA OR MALIGNANCY. - SURGICAL MARGIN, NEGATIVE FOR ATYPIA OR MALIGNANCY. 7. Lymph node, sentinel, biopsy, right axillary - ONE LYMPH NODE, NEGATIVE FOR TUMOR (0/1). 1 of 4 8. Lymph node, sentinel, biopsy, right axillary - ONE LYMPH NODE, NEGATIVE FOR TUMOR (0/1). 9. Lymph node, sentinel, biopsy, right axillary - ONE LYMPH NODE, NEGATIVE FOR TUMOR (0/1).   04/22/14 Diagnosis Breast, right, needle core biopsy, mass, UOQ - INVASIVE DUCTAL CARCINOMA, GRADE I, INVOLVING MULTIPLE CORES, APPROXIMATELY 5 MM IN MAXIMUM LINEAR EXTENT IN ANY ONE CORE. - CALCIFICATIONS IDENTIFIED. - NO DUCTAL CARCINOMA IN SITU PRESENT. - BREAST PROGNOSTIC MARKERS WILL BE ORDERED ON BLOCK 1C AND SEPARATELY REPORTED. - SEE COMMENT. Microscopic Comment  Receptor Status: ER(100%), PR (97%), Her2-neu (-), Ki-67(12%), Oncotype Dx (9)  On 09/30/2013 bilateral  screening mammography showed a possible asymmetry in the right breast. Right diagnostic mammography with tomosynthesis and right breast ultrasonography was performed 10/08/2013. On spot compression views there appears to be a persistent mixed attenuation mass in the right breast suggesting an intramammary lymph node. Ultrasound showed several small cysts not related to this finding. There was a diagnosed in her graphic abnormality noted associated with a possible abnormality.   Accordingly 6 month follow-up was suggested and performed 04/12/2014. On this date right diagnostic mammography and ultrasonography showed an oval 5 mm mass with indistinct med margins in the upper right breast. This was not palpable. Targeted ultrasound now found a hypoechoic mass measuring 5 mm in the area in question, and this was biopsied 04/22/2014  On 05/02/2014 the patient underwent bilateral breast MRI. This found the breast density to be category B. In the upper outer quadrant of the right breast there was a 6 mm enhancing mass with slightly irregular margins. There were no abnormal lymph nodes and the left breast was unremarkable. In the upper abdomen and left upper quadrant there were innumerable cysts, some measuring up to 6 cm in size.   Past/Anticipated interventions by surgeon, if any: Dr. Tobe Sos- Lumpectomy Right Breast  Past/Anticipated interventions by medical oncology, if any: Chemotherapy: Dr. Gunnar Bulla Magrinat - Anti-Estrogen Therapy  Lymphedema issues, if any:    Pain issues, if any: Reports tenderness in right axilla and pulling sensation in right axilla.  Numbness in the underside of her right arm.  Occasional shooting pain.   SAFETY ISSUES:  Prior radiation? No  Pacemaker/ICD? No  Possible current pregnancy?No  Is the patient on methotrexate? No  Current Complaints / other details:    Patient's last menstrual period was 05/17/2014. Menarche age 50. The patient is  G0 P0. She still having  regular periods. She used oral contraceptives between the ages of 26 and 31. She underwent hysteroscopy with "scraping" for uterine polyps in 2012. She also is status post partial oophorectomy on the right because of an ovarian cyst.     Mckoy Bhakta, Crista Curb, RN 06/02/2014,9:56 AM

## 2014-06-07 ENCOUNTER — Ambulatory Visit
Admission: RE | Admit: 2014-06-07 | Discharge: 2014-06-07 | Disposition: A | Discharge status: Home / Self Care | Payer: No Typology Code available for payment source | Source: Ambulatory Visit | Attending: Radiation Oncology | Admitting: Radiation Oncology

## 2014-06-07 ENCOUNTER — Encounter: Payer: Self-pay | Admitting: Radiation Oncology

## 2014-06-07 VITALS — BP 127/89 | HR 87 | Temp 99.0°F | Ht 66.0 in | Wt 179.5 lb

## 2014-06-07 DIAGNOSIS — L599 Disorder of the skin and subcutaneous tissue related to radiation, unspecified: Secondary | ICD-10-CM | POA: Diagnosis not present

## 2014-06-07 DIAGNOSIS — C50411 Malignant neoplasm of upper-outer quadrant of right female breast: Secondary | ICD-10-CM | POA: Diagnosis not present

## 2014-06-07 DIAGNOSIS — Z510 Encounter for antineoplastic radiation therapy: Secondary | ICD-10-CM | POA: Diagnosis not present

## 2014-06-07 DIAGNOSIS — Z170 Estrogen receptor positive status [ER+]: Secondary | ICD-10-CM | POA: Insufficient documentation

## 2014-06-07 DIAGNOSIS — N281 Cyst of kidney, acquired: Secondary | ICD-10-CM | POA: Diagnosis not present

## 2014-06-07 DIAGNOSIS — K7689 Other specified diseases of liver: Secondary | ICD-10-CM | POA: Diagnosis not present

## 2014-06-07 DIAGNOSIS — Z9889 Other specified postprocedural states: Secondary | ICD-10-CM | POA: Insufficient documentation

## 2014-06-07 NOTE — Progress Notes (Signed)
CC: Dr. Erroll Luna  Follow-up note:  Diagnosis: Stage I A (T1 N0 M0) invasive ductal carcinoma of the right breast  History: Brooke Ferguson is a pleasant 44 year old female who is seen today for review and scheduling of her right breast radiation therapy following conservative surgery in the management of her T1 N0 invasive ductal carcinoma of the right breast.  I saw her at the [facility] in consultation on 05/04/2014. There was the question of a right breast mass on mammography back in August. A six-month follow-up was recommended in this showed an oval 0.5 cm mass at 12:00 within the right breast. There is no palpable abnormality. Ultrasound showed a 0.5 x 0.4 x 0.3 cm mass at 12:00. The axilla appeared to be normal. A stereotactic biopsy on 04/22/2014 was diagnostic for grade I invasive ductal carcinoma which was ER positive at 100%, PR positive at 97% with a Ki-67 of 12%. HER-2/neu negative. It was not clear as to whether not the questionable mass seen on ultrasound was actually the lesion that was biopsied. Breast MR on February 29 showed a 0.6 cm mass within the upper-outer quadrant of the right breast. There were numerous hepatic cysts questioning the possibility of polycystic kidney disease.A right breast ultrasound on 05/06/2014 was without definite sonographic abnormality within the right breast.  The biopsy clip and known breast malignancy was difficult to visualize sonographically.  On 05/17/2014 she underwent a right partial mastectomy.  She was found to have a 0.9 cm invasive ductal carcinoma with the initial margin being 0.2 cm inferiorly.  Additional margins, superiorly, medially, laterally, and inferiorly were all negative.  A single sentinel lymph node was free of metastatic disease.  She is doing well postoperatively.  MR of the upper abdomen showed numerous cysts within the liver and kidney suggestive of polycystic disease.  She is without complaints today.  Oncotype DX testing  revealed a score of 9, 6% chance for recurrent disease with adjuvant hormone therapy.  Genetic testing was negative.  Physical examination: Alert and oriented. Filed Vitals:   06/07/14 1347  BP: 127/89  Pulse: 87  Temp: 99 F (37.2 C)   Head and neck examination: Grossly unremarkable.  Nodes: Without palpable cervical, supraclavicular, or axillary lymphadenopathy.  Breasts: There is a partial mastectomy wound along the upper-outer quadrant of the right breast at approximately 11:00 which is healing well.  No masses are appreciated.  Left breast without masses or lesions.  Extremities: Without edema.  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  We again discussed the options for fractionation which include standard fractionation versus hypofractionated treatment.  If her field separation is satisfactory we will give her hypofractionated treatment.  We discussed the potential acute and late toxicities of radiation therapy.  Consent is signed today.  She will return next week for CT simulation.  Dr. Jana Hakim will discuss with her the implications of possible polycystic disease.  Plan: As above.  30 minutes was spent face-to-face with the patient, primarily counseling the patient and coordinating her care.

## 2014-06-08 ENCOUNTER — Ambulatory Visit (HOSPITAL_BASED_OUTPATIENT_CLINIC_OR_DEPARTMENT_OTHER): Payer: PRIVATE HEALTH INSURANCE | Admitting: Oncology

## 2014-06-08 VITALS — BP 140/88 | HR 87 | Temp 98.3°F | Resp 18 | Ht 66.0 in | Wt 177.8 lb

## 2014-06-08 DIAGNOSIS — Z170 Estrogen receptor positive status [ER+]: Secondary | ICD-10-CM

## 2014-06-08 DIAGNOSIS — C50411 Malignant neoplasm of upper-outer quadrant of right female breast: Secondary | ICD-10-CM | POA: Diagnosis not present

## 2014-06-08 NOTE — Progress Notes (Signed)
[facility]  [phone] [phone]     ID: Brooke Ferguson DOB: [birth year]  MR#: [id number]  HXT#:[id number]  Patient Care Team: Brien Few, MD as PCP - General (Obstetrics and Gynecology) Erroll Luna, MD as Consulting Physician (General Surgery) Chauncey Cruel, MD as Consulting Physician (Oncology) Arloa Koh, MD as Consulting Physician (Radiation Oncology) Rockwell Germany, RN as Registered Nurse Mauro Kaufmann, RN as Registered Nurse Holley Bouche, NP as Nurse Practitioner (Nurse Practitioner) Brien Few, MD as Consulting Physician (Obstetrics and Gynecology) OTHER MD:  CHIEF COMPLAINT: early-stage estrogen receptor positive breast cancer  CURRENT TREATMENT: Awaiting adjuvant radiation   BREAST CANCER HISTORY: From the original intake note:  Brooke Ferguson has received yearly mammography since age 63. She has breast density category C. On 09/30/2013 bilateral screening mammography showed a possible asymmetry in the right breast. Right diagnostic mammography with tomosynthesis and right breast ultrasonography was performed 10/08/2013. On spot compression views there appears to be a persistent mixed attenuation mass in the right breast suggesting an intramammary lymph node. Ultrasound showed several small cysts not related to this finding. There was a diagnosed in her graphic abnormality noted associated with a possible abnormality.   Accordingly 6 month follow-up was suggested and performed 04/12/2014. On this date right diagnostic mammography and ultrasonography showed an oval 5 mm mass with indistinct med margins in the upper right breast. This was not palpable. Targeted ultrasound now found a hypoechoic mass measuring 5 mm in the area in question, and this was biopsied 04/22/2014. The pathology showed ([id number]) and invasive ductal carcinoma, grade 1, estrogen receptor 100% positive, progesterone receptor 97% positive, both with  strong staining intensity, with an MIB-1 of 12%, and no HER-2 amplification, the signals ratio being 1.21 and the number per cell 2.00.  On 05/02/2014 the patient underwent bilateral breast MRI. This found the breast density to be category B. In the upper outer quadrant of the right breast there was a 6 mm enhancing mass with slightly irregular margins. There were no abnormal lymph nodes and the left breast was unremarkable. In the upper abdomen and left upper quadrant there were innumerable cysts, some measuring up to 6 cm in size.  The patient's subsequent history is as detailed below.  INTERVAL HISTORY: Brooke Ferguson returns today for follow-up of her early stage breast cancer accompanied by her husband Brooke Ferguson.. Since her last visit here she underwent definitive right lumpectomy and sentinel lymph node sampling 05/17/2014. This showed ([id number]) a 0.9 cm invasive ductal carcinoma, grade 1, with all 3 sentinel lymph nodes negative. Margins were clear. An Oncotype DX was obtained from this sample and gave a recurrent score of 9, predicting a risk of outside the breast recurrence of 6% within 10 years if the patient's only systemic treatment is tamoxifen for 5 years. It also predicts no benefit from chemotherapy. Finally the patient underwent genetic testing which found no deleterious mutations in BRCA or an additional 22 genes tested. She is here today to discuss those results.  REVIEW OF SYSTEMS: She did well with surgery, with some soreness, but no bleeding or fever. She has some tightness in the right axilla that she wanted me to check today. She still having periods and she is not having any menopausal symptoms. She has not yet started an exercise program. A detailed review of systems today was otherwise negative  PAST MEDICAL HISTORY: Past Medical History  Diagnosis Date  . Bulging disc     BETWEEN C6-7, HAS  HAD 2 CORTISONE INJECTIONS  . 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 Breast  cancer of upper-outer quadrant of right female breast 04/26/2014    PAST SURGICAL HISTORY: Past Surgical History  Procedure Laterality Date  . Hysteroscopy with resectoscope  10/25/2010    Procedure: HYSTEROSCOPY WITH RESECTOSCOPE;  Surgeon: Lovenia Kim, MD;  Location: [facility];  Service: Gynecology;  Laterality: N/A;  Polypectomy  . Wisdom tooth extraction    . Dilation and curettage of uterus    . Diagnostic laparoscopy      OVARIAN CYCTS AT AGE 28 YRS    FAMILY HISTORY Family History  Problem Relation Age of Onset  . Cancer Mother     astrocytoma; deceased 39  . Breast cancer Maternal Grandmother     Great-Great Grandmotherlung; smoker; currently 78  . Cancer Other     colon; mat grandmother's sister  . Cancer Other     esophagus; mat grandmother's sister   The patient's father is alive, age 73 as of 2014-06-15. The patient's mother died at age 59 from an astrocytoma which was diagnosed a year earlier. The patient has no brothers, one sister. There is no history of breast cancer in the the immediate family. On the maternal side however the patient's mother's grandmother lived to be 8 but had been diagnosed with breast cancer at some point. There are also cases of lung cancer, esophageal cancer, and colon cancer on the mother's side of the family. There are no other breast or ovarian cancers to the patient's knowledge  GYNECOLOGIC HISTORY:  Patient's last menstrual period was 05/17/2014. Menarche age 76. The patient is GX P0. She still having regular periods. She used oral contraceptives between the ages of 49 and 52. She underwent hysteroscopy with "scraping" for uterine polyps in 2012. She also is status post partial oophorectomy on the right because of an ovarian cyst.  SOCIAL HISTORY:  She'll works as a Adult nurse. Brooke Ferguson works for a [employer] account pl[employer] (they do a lot of the things testing for defibrillators.). A stepson, Brooke Ferguson and, 61, lives with them. He is  currently a Ship broker. The patient is not a church attender    ADVANCED DIRECTIVES: not in place   HEALTH MAINTENANCE: History  Substance Use Topics  . Smoking status: Never Smoker   . Smokeless tobacco: Never Used  . Alcohol Use: Yes     Comment: SOCIALLY     Colonoscopy:never  IFO:YDXAJO 2015  Bone density:never  Lipid panel:  Allergies  Allergen Reactions  . Vicodin [Hydrocodone-Acetaminophen] Nausea And Vomiting    Current Outpatient Prescriptions  Medication Sig Dispense Refill  . betamethasone dipropionate (DIPROLENE) 0.05 % cream Apply topically daily.    Marland Kitchen oxyCODONE-acetaminophen (ROXICET) 5-325 MG per tablet Take 1-2 tablets by mouth every 4 (four) hours as needed. (Patient not taking: Reported on 06/07/2014) 30 tablet 0  . promethazine (PHENERGAN) 12.5 MG tablet Take 1 tablet (12.5 mg total) by mouth every 6 (six) hours as needed for nausea or vomiting. 30 tablet 0   No current facility-administered medications for this visit.    OBJECTIVE: young White woman in no acute distress Filed Vitals:   06/08/14 1558  BP: 140/88  Pulse: 87  Temp: 98.3 F (36.8 C)  Resp: 18     Body mass index is 28.71 kg/(m^2).    ECOG FS:0 - Asymptomatic  Ocular: Sclerae unicteric, EOMs intact Ear-nose-throat: Oropharynx , good dentition Lymphatic: No cervical or supraclavicular adenopathy  Lungs no rales or rhonchi Heart regular rate and rhythm Abd soft, nontender, positive bowel sounds, no masses palpated MSK no focal spinal tenderness, no upper extremity lymphedema Neuro: non-focal, well-oriented, appropriate affect Breasts: the right breast is status post lumpectomy. There is no evidence of residual disease. The incision is healing nicely, with minimal swelling, no erythema. In the right axilla laterally there is a "bleeder", namely an L on gated thin palpable lesion which is tender but not erythematous. This most likely represents a thrombosed superficial vein. The right axilla is  otherwise benign. The left breast is unremarkable.   LAB RESULTS:  CMP     Component Value Date/Time   NA 141 05/04/2014 1144   NA 141 03/18/2011 1200   K 4.5 05/04/2014 1144   K 3.7 03/18/2011 1200   CL 107 03/18/2011 1200   CO2 25 05/04/2014 1144   CO2 25 03/18/2011 1200   GLUCOSE 115 05/04/2014 1144   GLUCOSE 91 03/18/2011 1200   BUN 12.5 05/04/2014 1144   BUN 14 03/18/2011 1200   CREATININE 0.8 05/04/2014 1144   CREATININE 0.70 03/18/2011 1200   CALCIUM 9.6 05/04/2014 1144   CALCIUM 9.2 03/18/2011 1200   PROT 7.1 05/04/2014 1144   PROT 6.5 03/18/2011 1200   ALBUMIN 3.9 05/04/2014 1144   ALBUMIN 3.5 03/18/2011 1200   AST 17 05/04/2014 1144   AST 13 03/18/2011 1200   ALT 10 05/04/2014 1144   ALT 11 03/18/2011 1200   ALKPHOS 61 05/04/2014 1144   ALKPHOS 42 03/18/2011 1200   BILITOT 0.80 05/04/2014 1144   BILITOT 0.3 03/18/2011 1200   GFRNONAA >90 03/18/2011 1200   GFRAA >90 03/18/2011 1200    INo results found for: SPEP, UPEP  Lab Results  Component Value Date   WBC 6.3 05/04/2014   NEUTROABS 3.7 05/04/2014   HGB 14.8 05/04/2014   HCT 44.7 05/04/2014   MCV 97.7 05/04/2014   PLT 376 05/04/2014      Chemistry      Component Value Date/Time   NA 141 05/04/2014 1144   NA 141 03/18/2011 1200   K 4.5 05/04/2014 1144   K 3.7 03/18/2011 1200   CL 107 03/18/2011 1200   CO2 25 05/04/2014 1144   CO2 25 03/18/2011 1200   BUN 12.5 05/04/2014 1144   BUN 14 03/18/2011 1200   CREATININE 0.8 05/04/2014 1144   CREATININE 0.70 03/18/2011 1200      Component Value Date/Time   CALCIUM 9.6 05/04/2014 1144   CALCIUM 9.2 03/18/2011 1200   ALKPHOS 61 05/04/2014 1144   ALKPHOS 42 03/18/2011 1200   AST 17 05/04/2014 1144   AST 13 03/18/2011 1200   ALT 10 05/04/2014 1144   ALT 11 03/18/2011 1200   BILITOT 0.80 05/04/2014 1144   BILITOT 0.3 03/18/2011 1200       No results found for: LABCA2  No components found for: LABCA125  No results for input(s): INR in  the last 168 hours.  Urinalysis    Component Value Date/Time   COLORURINE YELLOW 03/18/2011 1322   APPEARANCEUR CLOUDY* 03/18/2011 1322   LABSPEC 1.026 03/18/2011 1322   PHURINE 6.0 03/18/2011 1322   GLUCOSEU NEGATIVE 03/18/2011 1322   HGBUR NEGATIVE 03/18/2011 1322   BILIRUBINUR SMALL* 03/18/2011 1322   KETONESUR >80* 03/18/2011 1322   PROTEINUR NEGATIVE 03/18/2011 1322   UROBILINOGEN 0.2 03/18/2011 1322   NITRITE NEGATIVE 03/18/2011 1322   LEUKOCYTESUR NEGATIVE 03/18/2011 1322    STUDIES: Mr Abdomen W Wo Contrast  05/30/2014   CLINICAL DATA:  History of breast cancer with light right lumpectomy. Diagnosed 2/16. Breast MRI demonstrating hepatic cysts.  EXAM: MRI ABDOMEN WITHOUT AND WITH CONTRAST  TECHNIQUE: Multiplanar multisequence MR imaging of the abdomen was performed both before and after the administration of intravenous contrast.  CONTRAST:  22m MULTIHANCE GADOBENATE DIMEGLUMINE 529 MG/ML IV SOLN  COMPARISON:  Breast MR of 05/02/2014.  FINDINGS: Lower chest: Normal heart size without pericardial or pleural effusion. Fluid collection in the right breast is likely postoperative. Example 2.7 cm on image 1 of series 3.  Hepatobiliary: Innumerable hepatic cysts. A cluster of cysts in the lateral segment left liver lobe, including a dominant 6.5 x 5.3 cm lesion on image 13 of series 3. Medial segment left liver lobe dominant 5.9 x 7.5 cm lesion.  Marked hepatomegaly, 24.1 cm craniocaudal.  Mild hepatic steatosis.  Hyper enhancing focus in the posterior segment right liver lobe measures 1.3 cm, including on image 41 of series 11004. Moderately T2 hyperintense on image 16 of series 8. Most consistent with a hemangioma.  No suspicious liver lesion. Normal gallbladder, without biliary ductal dilatation.  Pancreas: Normal, without mass or ductal dilatation.  Spleen: Normal  Adrenals/Urinary Tract: Normal adrenal glands. Bilateral renal cysts. Precontrast T1 hyperintense lesions are identified  within both kidneys. Example series 1000. No post-contrast enhancement in any of these lesions. No hydronephrosis.  Stomach/Bowel: Normal stomach, without wall thickening. Normal abdominal bowel loops.  Vascular/Lymphatic: Normal caliber of the aorta and branch vessels. No retroperitoneal or retrocrural adenopathy.  Other: No ascites.  Musculoskeletal: No acute osseous abnormality.  IMPRESSION: 1. Innumerable hepatic and renal cysts. 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 Some renal cysts demonstrate complexity. No enhancing renal lesion. 2. Hepatomegaly and hepatic steatosis.  Right hepatic hemangioma.   Electronically Signed   By: KAbigail MiyamotoM.D.   On: 05/30/2014 08:50   Nm Sentinel Node Inj-no Rpt (breast)  05/17/2014   CLINICAL DATA: right breast cancer   Sulfur colloid was injected intradermally by the nuclear medicine  technologist for breast cancer sentinel node localization.    Mm Breast Surgical Specimen  05/17/2014   CLINICAL DATA:  Specimen mammogram following radioactive seed localization and surgical excision of a right breast lesion.  EXAM: SPECIMEN RADIOGRAPH OF THE RIGHT BREAST  COMPARISON:  Previous exam(s).  FINDINGS: Status post excision of the right breast. The radioactive seed and biopsy marker clip are present, completely intact, and were marked for pathology.  IMPRESSION: Specimen radiograph of the right breast.   Electronically Signed   By: DLajean ManesM.D.   On: 05/17/2014 11:30   Mm Rt Radioactive Seed Loc Mammo Guide  05/16/2014   CLINICAL DATA:  44year old female for seed localization prior to right lumpectomy for breast cancer.  EXAM: MAMMOGRAPHIC GUIDED RADIOACTIVE SEED LOCALIZATION OF THE RIGHT BREAST  COMPARISON:  Previous exam(s).  FINDINGS: Patient presents for radioactive seed localization prior to right lumpectomy. I met with the patient and we discussed the procedure of seed localization including benefits and alternatives. We discussed the high likelihood  of a successful procedure. We discussed the risks of the procedure including infection, bleeding, tissue injury and further surgery. We discussed the low dose of radioactivity involved in the procedure. Informed, written consent was given.  The usual time-out protocol was performed immediately prior to the procedure.  Using mammographic guidance, sterile technique, 2% lidocaine and an I-125 radioactive seed, the biopsy clip was localized using a superior approach. The follow-up mammogram images confirm  the seed in the expected location and are marked for Dr. Brantley Stage.  Follow-up survey of the patient confirms presence of the radioactive seed.  Order number of I-125 seed:  [id number].  Total activity:  0.25 mCi  Reference Date: 05/04/2014  The patient tolerated the procedure well and was released from the [facility]. She was given instructions regarding seed removal.  IMPRESSION: Radioactive seed localization right breast. No apparent complications.   Electronically Signed   By: Margarette Canada M.D.   On: 05/16/2014 15:25    ASSESSMENT: 44 y.o. BRCA negative [place] woman status post right breast upper outer quadrant biopsy 04/22/2014 for a clinical T1b N0, stage IA  Invasive ductal carcinoma, grade 1, strongly estrogen and progesterone receptor positive, HER-2 negative, with an MIB-1 of 12%   (1) genetics testing using the [employer] gene panel/ [facility] showed no deleterious mutations in ATM, BARD1, BRCA1, BRCA2, BRIP1, CDH1, CHEK2, EPCAM, MLH1, MRE11A, MSH2, MSH6, MUTYH, NBN, NF1, PALB2, PMS2, PTEN, RAD50, RAD51C, RAD51D, SMARCA4, STK11, or TP53.   (2) left partial mastectomy with sentinel lymph node sampling 05/17/2014 showed a pT1b pN0, stage IA invasive ductal carcinoma, grade 1, with negative margins.   (3) Oncotype DX score of 9 predicts a 10 year risk of outside the breast recurrence of 6% if the patient's only systemic treatment is tamoxifen for 5 years. It also predicts no benefit from  chemotherapy.   (4)  The patient will benefit from adjuvant radiation   (5) anti-estrogens to follow radiation   (6) multiple hepatic and renal cysts benign per abdominal MRI 05/30/2014  PLAN: We spent approximately 45 minutes going over [place] situation in detail. She knows she has a very good prognosis overall. Specifically, if she takes tamoxifen for 5 years she will have a 94% chance of not developing stage IV breast cancer in the next 10 years.  Brooke Ferguson wanted to know the risk if she did not take tamoxifen or any antiestrogen. That would mean of course no systemic treatment, since her Oncotype score is low enough it predicts no benefit from chemotherapy. I was not able to give them a good round number but suggested it would be 12% or higher.  We then discussed antiestrogen's in detail. Ellean understands that she is still menstruating her only choice for now is tamoxifen. Once she becomes fully postmenopausal we can switch to an aromatase inhibitor and she may be able to finish anti-estrogens more quickly that way.  More importantly, I warned them against confusing association with causation. Any people think tamoxifen for example causes weight gain and insomnia and mood changes and it really does not do any of those things. The only way to establish that is to do the [facility] control double blinded trial's which we actually have for tamoxifen. Women in both groups have the same amount of depression, the same amount of weight gain, and the same amount of insomnia. I hope this was reassuring to [place].  Her next step however is to get through radiation. I anticipate that we will be done by the end of May. I would like her to spent perhaps a month recovering and then start antiestrogen's right after the July 4 holiday.  Alissa has a good understanding of the overall plan. She agrees with it. She knows the goal of treatment in her case is cure. She will call with any problems that may  develop before her next visit here.  Chauncey Cruel, MD   06/08/2014 4:09 PM Medical Oncology  and Hematology [facility] [address] [place], [place] [address] Tel. [phone]    Fax. [phone]

## 2014-06-09 ENCOUNTER — Telehealth: Payer: Self-pay | Admitting: Oncology

## 2014-06-09 NOTE — Telephone Encounter (Signed)
per pof ot sch pt appt-gave pt copy of sch °

## 2014-06-13 ENCOUNTER — Ambulatory Visit
Admission: RE | Admit: 2014-06-13 | Discharge: 2014-06-13 | Disposition: A | Discharge status: Home / Self Care | Payer: No Typology Code available for payment source | Source: Ambulatory Visit | Attending: Radiation Oncology | Admitting: Radiation Oncology

## 2014-06-13 DIAGNOSIS — C50411 Malignant neoplasm of upper-outer quadrant of right female breast: Secondary | ICD-10-CM

## 2014-06-13 DIAGNOSIS — Z510 Encounter for antineoplastic radiation therapy: Secondary | ICD-10-CM | POA: Diagnosis not present

## 2014-06-13 NOTE — Progress Notes (Signed)
Complex simulation/treatment planning note: The patient was taken to the CT simulator.  A Vac lock immobilization device was constructed on a custom breast board.  Her right breast field borders were marked with radiopaque wires.  Her right partial mastectomy scar was also marked with a radiopaque wire.  She was then scanned.  Cor normal anatomy was contoured by dosimetry, and I contoured her upper-outer quadrant tumor bed.  I'm prescribing 4250 cGy 17 sessions.  No boost.  She is now ready for 3-D simulation.

## 2014-06-14 ENCOUNTER — Telehealth: Payer: Self-pay | Admitting: Nutrition

## 2014-06-14 ENCOUNTER — Ambulatory Visit: Payer: PRIVATE HEALTH INSURANCE | Admitting: Nutrition

## 2014-06-14 ENCOUNTER — Encounter: Payer: Self-pay | Admitting: Radiation Oncology

## 2014-06-14 DIAGNOSIS — Z510 Encounter for antineoplastic radiation therapy: Secondary | ICD-10-CM | POA: Diagnosis not present

## 2014-06-14 NOTE — Telephone Encounter (Signed)
Contacted patient at her request. Patient has questions regarding diet during radiation therapy for breast cancer. Attempted to make appointment with patient however, patient decided that she would rather have me mail information. Educated patient briefly on healthy low-fat plant-based diet during breast cancer treatment. Mailed fact sheets on nutrition for the breast cancer patient and plant-based diet. Provided contact information.  **Disclaimer: This note was dictated with voice recognition software. Similar sounding words can inadvertently be transcribed and this note may contain transcription errors which may not have been corrected upon publication of note.**

## 2014-06-14 NOTE — Progress Notes (Signed)
3-D simulation note: The patient completed 3-D simulation for treatment to her right breast.  Dose volume histograms were obtained for the left breast tumor bed/target and also avoidance structures including the heart and lungs.  We met our departmental guidelines.  She is set up to medial and lateral right breast tangents, each tangent having a unique electronic compensation field and also a unique [place] for a total of 4 complex treatment devices.  I prescribing 4250 cGy in 17 sessions utilizing 6 MV photons.

## 2014-06-15 DIAGNOSIS — Z510 Encounter for antineoplastic radiation therapy: Secondary | ICD-10-CM | POA: Diagnosis not present

## 2014-06-17 NOTE — Addendum Note (Signed)
Encounter addended by: Doreen Beam, RN on: 06/17/2014  9:26 AM<BR>     Documentation filed: Charges VN

## 2014-06-20 ENCOUNTER — Ambulatory Visit
Admission: RE | Admit: 2014-06-20 | Discharge: 2014-06-20 | Disposition: A | Discharge status: Home / Self Care | Payer: No Typology Code available for payment source | Source: Ambulatory Visit | Attending: Radiation Oncology | Admitting: Radiation Oncology

## 2014-06-20 DIAGNOSIS — Z510 Encounter for antineoplastic radiation therapy: Secondary | ICD-10-CM | POA: Diagnosis not present

## 2014-06-21 ENCOUNTER — Ambulatory Visit
Admission: RE | Admit: 2014-06-21 | Discharge: 2014-06-21 | Disposition: A | Discharge status: Home / Self Care | Payer: No Typology Code available for payment source | Source: Ambulatory Visit | Attending: Radiation Oncology | Admitting: Radiation Oncology

## 2014-06-21 DIAGNOSIS — Z510 Encounter for antineoplastic radiation therapy: Secondary | ICD-10-CM | POA: Diagnosis not present

## 2014-06-22 ENCOUNTER — Encounter: Payer: Self-pay | Admitting: Oncology

## 2014-06-22 ENCOUNTER — Ambulatory Visit
Admission: RE | Admit: 2014-06-22 | Discharge: 2014-06-22 | Disposition: A | Discharge status: Home / Self Care | Payer: No Typology Code available for payment source | Source: Ambulatory Visit | Attending: Radiation Oncology | Admitting: Radiation Oncology

## 2014-06-22 ENCOUNTER — Ambulatory Visit
Admission: RE | Admit: 2014-06-22 | Discharge: 2014-06-22 | Disposition: A | Discharge status: Home / Self Care | Payer: PRIVATE HEALTH INSURANCE | Source: Ambulatory Visit | Attending: Radiation Oncology | Admitting: Radiation Oncology

## 2014-06-22 DIAGNOSIS — C50411 Malignant neoplasm of upper-outer quadrant of right female breast: Secondary | ICD-10-CM

## 2014-06-22 DIAGNOSIS — Z510 Encounter for antineoplastic radiation therapy: Secondary | ICD-10-CM | POA: Diagnosis not present

## 2014-06-22 MED ORDER — RADIAPLEXRX EX GEL
Freq: Once | CUTANEOUS | Status: AC
  Administered 2014-06-22: 18:00:00 via TOPICAL

## 2014-06-22 MED ORDER — ALRA NON-METALLIC DEODORANT (RAD-ONC)
1.0000 "application " | Freq: Once | TOPICAL | Status: AC
  Administered 2014-06-22: 1 via TOPICAL

## 2014-06-22 MED ORDER — ALRA NON-METALLIC DEODORANT (RAD-ONC): 1.0000 "application " | Freq: Once | TOPICAL | Status: DC

## 2014-06-22 MED ORDER — RADIAPLEXRX EX GEL: Freq: Once | CUTANEOUS | Status: DC

## 2014-06-22 NOTE — Progress Notes (Signed)
Pt here for patient teaching.  Pt given Radiation and You booklet, skin care instructions, Alra deodorant and Radiaplex gel. Reviewed areas of pertinence such as fatigue, hair loss, skin changes, breast tenderness and breast swelling . Pt able to give teach back of to pat skin, use unscented/gentle soap, use baby wipes, have Imodium on hand, drink plenty of water and sitz bath,apply Radiaplex bid, avoid applying anything to skin within 4 hours of treatment, avoid wearing an under wire bra and to use an electric razor if they must shave. Pt demonstrated understanding and verbalizes understanding of information given and will contact nursing with any questions or concerns.  Given this RN's Business Card.  Informed that Dr. [name] sees his patients every Monday, following treatment, but if this changes she will be notified in advance

## 2014-06-22 NOTE — Progress Notes (Signed)
I faxed [employer] the assessment form  [phone]

## 2014-06-23 ENCOUNTER — Ambulatory Visit
Admission: RE | Admit: 2014-06-23 | Discharge: 2014-06-23 | Disposition: A | Discharge status: Home / Self Care | Payer: No Typology Code available for payment source | Source: Ambulatory Visit | Attending: Radiation Oncology | Admitting: Radiation Oncology

## 2014-06-23 DIAGNOSIS — Z510 Encounter for antineoplastic radiation therapy: Secondary | ICD-10-CM | POA: Diagnosis not present

## 2014-06-24 ENCOUNTER — Ambulatory Visit
Admission: RE | Admit: 2014-06-24 | Discharge: 2014-06-24 | Disposition: A | Discharge status: Home / Self Care | Payer: No Typology Code available for payment source | Source: Ambulatory Visit | Attending: Radiation Oncology | Admitting: Radiation Oncology

## 2014-06-24 DIAGNOSIS — Z510 Encounter for antineoplastic radiation therapy: Secondary | ICD-10-CM | POA: Diagnosis not present

## 2014-06-27 ENCOUNTER — Ambulatory Visit
Admission: RE | Admit: 2014-06-27 | Discharge: 2014-06-27 | Disposition: A | Discharge status: Home / Self Care | Payer: No Typology Code available for payment source | Source: Ambulatory Visit | Attending: Radiation Oncology | Admitting: Radiation Oncology

## 2014-06-27 ENCOUNTER — Encounter: Payer: Self-pay | Admitting: Radiation Oncology

## 2014-06-27 ENCOUNTER — Encounter: Payer: Self-pay | Admitting: *Deleted

## 2014-06-27 VITALS — BP 118/78 | HR 72 | Temp 98.4°F | Ht 66.0 in | Wt 178.6 lb

## 2014-06-27 DIAGNOSIS — C50411 Malignant neoplasm of upper-outer quadrant of right female breast: Secondary | ICD-10-CM

## 2014-06-27 DIAGNOSIS — Z510 Encounter for antineoplastic radiation therapy: Secondary | ICD-10-CM | POA: Diagnosis not present

## 2014-06-27 NOTE — Progress Notes (Signed)
Weekly Management Note:  Site: Right breast Current Dose:  1250  cGy Projected Dose: 4250  cGy, no boost  Narrative: The patient is seen today for routine under treatment assessment. CBCT/MVCT images/port films were reviewed. The chart was reviewed.   She is without complaints today except for discomfort along her right nipple.  This is worse when she walks her large dog.  She was given Telfa pads to use inside her bra.  Physical Examination:  Filed Vitals:   06/27/14 1237  BP: 118/78  Pulse: 72  Temp: 98.4 F (36.9 C)  .  Weight: 178 lb 9.6 oz (81.012 kg).  No significant skin changes.  Impression: Tolerating radiation therapy well.  Plan: Continue radiation therapy as planned.

## 2014-06-27 NOTE — Progress Notes (Signed)
Brooke Ferguson has received 5 fractions to her right breast and is experiencing tenderness in the nipple region which is reddened at this time. Given telpha pads to wear inside her bra.

## 2014-06-27 NOTE — Progress Notes (Signed)
Met with pt during xrt wkly check. Relate she is doing well. Denies needs or concerns at this time. Encourage pt to call with questions. Received verbal understanding.

## 2014-06-28 ENCOUNTER — Ambulatory Visit
Admission: RE | Admit: 2014-06-28 | Discharge: 2014-06-28 | Disposition: A | Discharge status: Home / Self Care | Payer: No Typology Code available for payment source | Source: Ambulatory Visit | Attending: Radiation Oncology | Admitting: Radiation Oncology

## 2014-06-28 DIAGNOSIS — Z510 Encounter for antineoplastic radiation therapy: Secondary | ICD-10-CM | POA: Diagnosis not present

## 2014-06-29 ENCOUNTER — Ambulatory Visit
Admission: RE | Admit: 2014-06-29 | Discharge: 2014-06-29 | Disposition: A | Discharge status: Home / Self Care | Payer: No Typology Code available for payment source | Source: Ambulatory Visit | Attending: Radiation Oncology | Admitting: Radiation Oncology

## 2014-06-29 DIAGNOSIS — Z510 Encounter for antineoplastic radiation therapy: Secondary | ICD-10-CM | POA: Diagnosis not present

## 2014-06-30 ENCOUNTER — Ambulatory Visit
Admission: RE | Admit: 2014-06-30 | Discharge: 2014-06-30 | Disposition: A | Discharge status: Home / Self Care | Payer: No Typology Code available for payment source | Source: Ambulatory Visit | Attending: Radiation Oncology | Admitting: Radiation Oncology

## 2014-06-30 DIAGNOSIS — Z510 Encounter for antineoplastic radiation therapy: Secondary | ICD-10-CM | POA: Diagnosis not present

## 2014-07-01 ENCOUNTER — Ambulatory Visit
Admission: RE | Admit: 2014-07-01 | Discharge: 2014-07-01 | Disposition: A | Discharge status: Home / Self Care | Payer: No Typology Code available for payment source | Source: Ambulatory Visit | Attending: Radiation Oncology | Admitting: Radiation Oncology

## 2014-07-01 DIAGNOSIS — Z510 Encounter for antineoplastic radiation therapy: Secondary | ICD-10-CM | POA: Diagnosis not present

## 2014-07-04 ENCOUNTER — Ambulatory Visit
Admission: RE | Admit: 2014-07-04 | Discharge: 2014-07-04 | Disposition: A | Discharge status: Home / Self Care | Payer: PRIVATE HEALTH INSURANCE | Source: Ambulatory Visit | Attending: Radiation Oncology | Admitting: Radiation Oncology

## 2014-07-04 ENCOUNTER — Ambulatory Visit
Admission: RE | Admit: 2014-07-04 | Discharge: 2014-07-04 | Disposition: A | Discharge status: Home / Self Care | Payer: No Typology Code available for payment source | Source: Ambulatory Visit | Attending: Radiation Oncology | Admitting: Radiation Oncology

## 2014-07-04 VITALS — BP 132/78 | HR 68 | Temp 99.1°F | Resp 12 | Wt 178.7 lb

## 2014-07-04 DIAGNOSIS — Z510 Encounter for antineoplastic radiation therapy: Secondary | ICD-10-CM | POA: Diagnosis not present

## 2014-07-04 DIAGNOSIS — C50411 Malignant neoplasm of upper-outer quadrant of right female breast: Secondary | ICD-10-CM

## 2014-07-04 NOTE — Progress Notes (Signed)
Weekly Management Note:  Site: Right breast Current Dose:  2500  cGy Projected Dose: 4250  cGy  Narrative: The patient is seen today for routine under treatment assessment. CBCT/MVCT images/port films were reviewed. The chart was reviewed.   She does have right breast discomfort including her nipple.  She uses Radioplex gel.  Physical Examination:  Filed Vitals:   07/04/14 1701  BP: 132/78  Pulse: 68  Temp: 99.1 F (37.3 C)  Resp: 12  .  Weight: 178 lb 11.2 oz (81.058 kg).  There is mild to moderate erythema along the right breast without desquamation of skin.  Impression: Tolerating radiation therapy well.  Plan: Continue radiation therapy as planned.

## 2014-07-04 NOTE — Progress Notes (Signed)
She is currently in no pain.  Pt complains of fatigue.  Pt right breast- positive for erythema and breast tenderness, "twingy" nipple pain.  Pt denies edema. Pt continues to apply Radiaplex as directed. BP 132/78 mmHg  Pulse 68  Temp(Src) 99.1 F (37.3 C) (Oral)  Resp 12  Wt 178 lb 11.2 oz (81.058 kg)  SpO2 100%  LMP 05/17/2014 Ibuprofen 800mg  for root canal pain.  Noted a slightly elevated temp which she reports was elevated to 99.6 a few weeks ago.  Instructed that prior to taking to next dose of Motrin to assess temperature.

## 2014-07-05 ENCOUNTER — Ambulatory Visit
Admission: RE | Admit: 2014-07-05 | Discharge: 2014-07-05 | Disposition: A | Discharge status: Home / Self Care | Payer: No Typology Code available for payment source | Source: Ambulatory Visit | Attending: Radiation Oncology | Admitting: Radiation Oncology

## 2014-07-05 DIAGNOSIS — Z510 Encounter for antineoplastic radiation therapy: Secondary | ICD-10-CM | POA: Diagnosis not present

## 2014-07-06 ENCOUNTER — Ambulatory Visit
Admission: RE | Admit: 2014-07-06 | Discharge: 2014-07-06 | Disposition: A | Discharge status: Home / Self Care | Payer: No Typology Code available for payment source | Source: Ambulatory Visit | Attending: Radiation Oncology | Admitting: Radiation Oncology

## 2014-07-06 DIAGNOSIS — Z510 Encounter for antineoplastic radiation therapy: Secondary | ICD-10-CM | POA: Diagnosis not present

## 2014-07-07 ENCOUNTER — Ambulatory Visit
Admission: RE | Admit: 2014-07-07 | Discharge: 2014-07-07 | Disposition: A | Discharge status: Home / Self Care | Payer: No Typology Code available for payment source | Source: Ambulatory Visit | Attending: Radiation Oncology | Admitting: Radiation Oncology

## 2014-07-07 DIAGNOSIS — Z510 Encounter for antineoplastic radiation therapy: Secondary | ICD-10-CM | POA: Diagnosis not present

## 2014-07-07 DIAGNOSIS — C50411 Malignant neoplasm of upper-outer quadrant of right female breast: Secondary | ICD-10-CM

## 2014-07-07 MED ORDER — BIAFINE EX EMUL
CUTANEOUS | Status: DC | PRN
  Administered 2014-07-07: 18:00:00 via TOPICAL

## 2014-07-07 NOTE — Progress Notes (Signed)
Patient brought to nursing for skin assessment of new onset itching follicular rash.after discussion with Dr.Murray patient given biafine.Instructed to apply 2 to 3 times daily but not 3 to 4 hours prior to radiation.

## 2014-07-08 ENCOUNTER — Ambulatory Visit
Admission: RE | Admit: 2014-07-08 | Discharge: 2014-07-08 | Disposition: A | Discharge status: Home / Self Care | Payer: No Typology Code available for payment source | Source: Ambulatory Visit | Attending: Radiation Oncology | Admitting: Radiation Oncology

## 2014-07-08 DIAGNOSIS — Z510 Encounter for antineoplastic radiation therapy: Secondary | ICD-10-CM | POA: Diagnosis not present

## 2014-07-11 ENCOUNTER — Ambulatory Visit
Admission: RE | Admit: 2014-07-11 | Discharge: 2014-07-11 | Disposition: A | Discharge status: Home / Self Care | Payer: No Typology Code available for payment source | Source: Ambulatory Visit | Attending: Radiation Oncology | Admitting: Radiation Oncology

## 2014-07-11 ENCOUNTER — Encounter: Payer: Self-pay | Admitting: Radiation Oncology

## 2014-07-11 ENCOUNTER — Ambulatory Visit
Admission: RE | Admit: 2014-07-11 | Discharge: 2014-07-11 | Disposition: A | Discharge status: Home / Self Care | Payer: PRIVATE HEALTH INSURANCE | Source: Ambulatory Visit | Attending: Radiation Oncology | Admitting: Radiation Oncology

## 2014-07-11 VITALS — BP 117/74 | HR 69 | Temp 98.4°F | Ht 66.0 in | Wt 181.5 lb

## 2014-07-11 DIAGNOSIS — Z510 Encounter for antineoplastic radiation therapy: Secondary | ICD-10-CM | POA: Diagnosis not present

## 2014-07-11 DIAGNOSIS — C50411 Malignant neoplasm of upper-outer quadrant of right female breast: Secondary | ICD-10-CM

## 2014-07-11 NOTE — Progress Notes (Signed)
Weekly Management Note:  Site: Right breast Current Dose:  3750   cGy Projected Dose: 4250  cGy  Narrative: The patient is seen today for routine under treatment assessment. CBCT/MVCT images/port films were reviewed. The chart was reviewed.   She is generally doing well and is without new complaints today except for more pruritus along the upper inner portion of her right breast.  She will finish radiation therapy this Wednesday.  Physical Examination:  Filed Vitals:   07/11/14 1700  BP: 117/74  Pulse: 69  Temp: 98.4 F (36.9 C)  .  Weight: 181 lb 8 oz (82.328 kg).  There is moderate erythema the skin along the right breast without areas of desquamation.  She does have a papular erythema along the upper inner quadrant.  Impression: Tolerating radiation therapy well.  I told she may use hydrocortisone cream when necessary.  Plan: Continue radiation therapy as planned.  One-month follow-up after completion of radiation therapy.

## 2014-07-11 NOTE — Progress Notes (Signed)
Brooke Ferguson has received 15 fractions to her right breast.  Her main concern today is itching in the inner, upper portion of her right breast and is requesting relief from the itching.  She also reports fatigue.

## 2014-07-12 ENCOUNTER — Ambulatory Visit
Admission: RE | Admit: 2014-07-12 | Discharge: 2014-07-12 | Disposition: A | Discharge status: Home / Self Care | Payer: No Typology Code available for payment source | Source: Ambulatory Visit | Attending: Radiation Oncology | Admitting: Radiation Oncology

## 2014-07-12 DIAGNOSIS — Z510 Encounter for antineoplastic radiation therapy: Secondary | ICD-10-CM | POA: Diagnosis not present

## 2014-07-13 ENCOUNTER — Ambulatory Visit
Admission: RE | Admit: 2014-07-13 | Discharge: 2014-07-13 | Disposition: A | Discharge status: Home / Self Care | Payer: No Typology Code available for payment source | Source: Ambulatory Visit | Attending: Radiation Oncology | Admitting: Radiation Oncology

## 2014-07-13 DIAGNOSIS — Z510 Encounter for antineoplastic radiation therapy: Secondary | ICD-10-CM | POA: Diagnosis not present

## 2014-07-14 ENCOUNTER — Telehealth: Payer: Self-pay | Admitting: *Deleted

## 2014-07-14 NOTE — Telephone Encounter (Signed)
Spoke to pt, congratulated on success of completing xrt. Denies needs or questions at this time. Encourage pt to call with concerns. Received verbal understanding.

## 2014-07-16 ENCOUNTER — Encounter: Payer: Self-pay | Admitting: Radiation Oncology

## 2014-07-16 NOTE — Progress Notes (Signed)
[facility] End of Treatment Note  Name:Kelsa Strength  Date: 07/16/2014 FWY:[id number] DOB:[birth year]   Status:outpatient    CC: Lovenia Kim, MD  Dr. Erroll Luna  REFERRING PHYSICIAN: Dr. Erroll Luna   DIAGNOSIS:  Stage I A (T1 N0 M0) invasive ductal carcinoma of the right breast  INDICATION FOR TREATMENT: Curative   TREATMENT DATES: 06/21/2014 through 07/13/2014                          SITE/DOSE:  Right breast 4250 cGy in 17 sessions                          BEAMS/ENERGY:   6 MV photons with tangential fields                NARRATIVE:  The patient tolerated treatment well with moderate erythema the skin and patchy dry desquamation but no areas of moist desquamation by completion of therapy.  She had pruritus along the upper inner quadrant of her breast which was treated with hydrocortisone cream.                          PLAN: Routine followup in one month. Patient instructed to call if questions or worsening complaints in interim.

## 2014-07-19 ENCOUNTER — Other Ambulatory Visit: Payer: Self-pay

## 2014-07-19 ENCOUNTER — Other Ambulatory Visit: Payer: Self-pay | Admitting: Surgery

## 2014-07-19 ENCOUNTER — Other Ambulatory Visit: Payer: Self-pay | Admitting: *Deleted

## 2014-07-19 ENCOUNTER — Other Ambulatory Visit (HOSPITAL_COMMUNITY): Payer: Self-pay | Admitting: Surgery

## 2014-07-19 DIAGNOSIS — I890 Lymphedema, not elsewhere classified: Secondary | ICD-10-CM

## 2014-07-19 DIAGNOSIS — M7989 Other specified soft tissue disorders: Secondary | ICD-10-CM

## 2014-07-19 DIAGNOSIS — M25529 Pain in unspecified elbow: Secondary | ICD-10-CM

## 2014-07-19 NOTE — Addendum Note (Signed)
Addended by: Turner Daniels on: 07/19/2014 03:37 PM   Modules accepted: Orders

## 2014-07-20 ENCOUNTER — Ambulatory Visit (HOSPITAL_COMMUNITY)
Admission: RE | Admit: 2014-07-20 | Discharge: 2014-07-20 | Disposition: A | Discharge status: Home / Self Care | Payer: PRIVATE HEALTH INSURANCE | Source: Ambulatory Visit | Attending: Surgery | Admitting: Surgery

## 2014-07-20 ENCOUNTER — Other Ambulatory Visit: Payer: Self-pay

## 2014-07-20 DIAGNOSIS — Z9221 Personal history of antineoplastic chemotherapy: Secondary | ICD-10-CM | POA: Insufficient documentation

## 2014-07-20 DIAGNOSIS — C801 Malignant (primary) neoplasm, unspecified: Secondary | ICD-10-CM | POA: Insufficient documentation

## 2014-07-20 DIAGNOSIS — M7989 Other specified soft tissue disorders: Secondary | ICD-10-CM | POA: Diagnosis not present

## 2014-07-20 DIAGNOSIS — M79629 Pain in unspecified upper arm: Secondary | ICD-10-CM

## 2014-07-20 DIAGNOSIS — M25529 Pain in unspecified elbow: Secondary | ICD-10-CM

## 2014-07-20 NOTE — Progress Notes (Signed)
Right upper extremity venous duplex completed:  No evidence of DVT or superficial thrombosis.    

## 2014-07-20 NOTE — Addendum Note (Signed)
Addended by: Erroll Luna A on: 07/20/2014 09:50 AM   Modules accepted: Orders

## 2014-07-21 ENCOUNTER — Ambulatory Visit: Payer: No Typology Code available for payment source | Attending: Surgery | Admitting: Physical Therapy

## 2014-07-21 ENCOUNTER — Encounter: Payer: Self-pay | Admitting: Physical Therapy

## 2014-07-21 DIAGNOSIS — C50411 Malignant neoplasm of upper-outer quadrant of right female breast: Secondary | ICD-10-CM | POA: Diagnosis not present

## 2014-07-21 DIAGNOSIS — Z9189 Other specified personal risk factors, not elsewhere classified: Secondary | ICD-10-CM | POA: Insufficient documentation

## 2014-07-21 DIAGNOSIS — I890 Lymphedema, not elsewhere classified: Secondary | ICD-10-CM

## 2014-07-21 DIAGNOSIS — M79601 Pain in right arm: Secondary | ICD-10-CM

## 2014-07-21 DIAGNOSIS — E8989 Other postprocedural endocrine and metabolic complications and disorders: Secondary | ICD-10-CM

## 2014-07-21 NOTE — Patient Instructions (Signed)
Discussed use of a compression sleeve and gauntlet with her to be worn on a daily basis (not to sleep in) for 4 weeks consistently to reduce swelling while undergoing manual lymph drainage.  Explained where to be fitted ([facility]) and the amount of compression that is appropriate (30-40 mmHg).  She verbalized good understanding.  Phoned Dr. Josetta Huddle office and requested an order be faxed to [address] so she could file insurance for her garments.  The nurse stated he would do it tomorrow.  Phoned [facility] to let them know the plan.

## 2014-07-21 NOTE — Therapy (Signed)
[place], [place], [address] Phone: [phone]   Fax:  [phone]  Physical Therapy Evaluation  Patient Details  Name: Brooke Ferguson MRN: [id number] Date of Birth: 1970/04/19 Referring Provider:  Erroll Luna, MD  Encounter Date: 07/21/2014      PT End of Session - 07/21/14 1130    Visit Number 1   Number of Visits 16   Date for PT Re-Evaluation 09/15/14   PT Start Time 0752   PT Stop Time 0849   PT Time Calculation (min) 57 min   Activity Tolerance Patient tolerated treatment well   Behavior During Therapy [facility] for tasks assessed/performed      Past Medical History  Diagnosis Date  . Bulging disc     BETWEEN C6-7, HAS HAD 2 CORTISONE INJECTIONS  . 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 Breast cancer of upper-outer quadrant of right female breast 04/26/2014  . History of lumpectomy of right breast 05/17/14    With sentinel node biopsy    Past Surgical History  Procedure Laterality Date  . Hysteroscopy with resectoscope  10/25/2010    Procedure: HYSTEROSCOPY WITH RESECTOSCOPE;  Surgeon: Lovenia Kim, MD;  Location: [facility];  Service: Gynecology;  Laterality: N/A;  Polypectomy  . Wisdom tooth extraction    . Dilation and curettage of uterus    . Diagnostic laparoscopy      OVARIAN CYCTS AT AGE 1 YRS    There were no vitals filed for this visit.  Visit Diagnosis:  Carcinoma of upper-outer quadrant of right female breast - Plan: PT PLAN OF CARE CERT/RE-CERT  Post-lymphadenectomy lymphedema of arm - Plan: PT PLAN OF CARE CERT/RE-CERT  Right arm pain - Plan: PT PLAN OF CARE CERT/RE-CERT      Subjective Assessment - 07/21/14 0801    Subjective Patient underwent a right lumpectomy with SLNB on 05/17/14.  Nodes were negative, Oncotype was 9 which did not require chemotherapy and she completed radiation 05/13/14.     Pertinent History Patient began having tightness in her  right arm after surgery but subsided after 1 month.  Then she began 17 days of radiation and during the past week of radiation she began to have right forearm tightness and her right arm began swelling around 07/15/13.   Patient Stated Goals Reduce swelling and tightness in right arm   Currently in Pain? Yes   Pain Score 4    Pain Location Arm  right forearm   Pain Orientation Right   Pain Descriptors / Indicators Aching   Pain Type Acute pain   Pain Onset In the past 7 days   Pain Frequency Constant   Aggravating Factors  swelling   Pain Relieving Factors nothing   Multiple Pain Sites No   Multiple Pain Sites No            [facility] PT Assessment - 07/21/14 0001    Assessment   Medical Diagnosis s/p right lumpectomy and SLNB; right arm lymphedema   Onset Date 07/16/14   Precautions   Precautions --  recent right breast radiation   Restrictions   Weight Bearing Restrictions No   Balance Screen   Has the patient fallen in the past 6 months No   Has the patient had a decrease in activity level because of a fear of falling?  No   Is the patient reluctant to leave their home because of a fear of falling?  No  Home Environment   Living Enviornment Private residence   Living Arrangements Spouse/significant other  45 y.o. stepson   Available Help at Discharge Family   Prior Function   Level of [employer] with basic ADLs   Vocation Full time employment  Adult nurse   Vocation Requirements on computer most of day   Leisure not exercising   Cognition   Overall Cognitive Status Within Functional Limits for tasks assessed   Observation/Other Assessments   Observations Red rash on right chest from radiation   Posture/Postural Control   Posture/Postural Control No significant limitations   ROM / Strength   AROM / PROM / Strength AROM;Strength   AROM   AROM Assessment Site Shoulder   Right/Left Shoulder Right   Right Shoulder Extension 60 Degrees   Right  Shoulder Flexion 141 Degrees   Right Shoulder ABduction 165 Degrees   Right Shoulder Internal Rotation 74 Degrees   Right Shoulder External Rotation 85 Degrees   Strength   Overall Strength Within functional limits for tasks performed   Strength Assessment Site Shoulder  Right   Palpation   Palpation tender to palpation right anterior forearm with pitting edema           LYMPHEDEMA/ONCOLOGY QUESTIONNAIRE - 07/21/14 0813    Type   Cancer Type Right breast   Surgeries   Lumpectomy Date 05/17/14   Sentinel Lymph Node Biopsy Date 05/17/14   Number Lymph Nodes Removed 3   Date Lymphedema/Swelling Started   Date 07/16/14   Treatment   Active Chemotherapy Treatment No   Past Chemotherapy Treatment No   Active Radiation Treatment No   Past Radiation Treatment Yes   Date 07/13/14   Body Site right breast   Current Hormone Treatment No   Past Hormone Therapy No   What other symptoms do you have   Are you Having Heaviness or Tightness Yes   Are you having Pain Yes   Are you having pitting edema Yes   Body Site right anterior forearm   Is it Hard or Difficult finding clothes that fit No   Do you have infections Yes   Comments After surgery had fever and went on antibiotics x7 days   Is there Decreased scar mobility No   Stemmer Sign No   Lymphedema Stage   Stage STAGE 2 SPONTANEOUSLY IRREVERSIBLE   Lymphedema Assessments   Lymphedema Assessments Upper extremities   Right Upper Extremity Lymphedema   10 cm Proximal to Olecranon Process 27 cm   Olecranon Process 24.3 cm   10 cm Proximal to Ulnar Styloid Process 21.1 cm   Just Proximal to Ulnar Styloid Process 15.6 cm   Across Hand at [employer] 18 cm   At [place] of 2nd Digit 5.7 cm           Quick Dash - 07/21/14 0001    Open a tight or new jar Moderate difficulty   Do heavy household chores (wash walls, wash floors) Mild difficulty   Carry a shopping bag or briefcase Mild difficulty   Wash your back No  difficulty   Use a knife to cut food No difficulty   Recreational activities in which you take some force or impact through your arm, shoulder, or hand (golf, hammering, tennis) Moderate difficulty   During the past week, to what extent has your arm, shoulder or hand problem interfered with your normal social activities with family, friends, neighbors, or groups? Slightly   During the past week, to what  extent has your arm, shoulder or hand problem limited your work or other regular daily activities Not at all   Arm, shoulder, or hand pain. Severe   Tingling (pins and needles) in your arm, shoulder, or hand Moderate   Difficulty Sleeping No difficulty   DASH Score 27.27 %             [facility] Adult PT Treatment/Exercise - 07/21/14 0001    Manual Therapy   Manual Therapy Manual Lymphatic Drainage (MLD)   Manual Lymphatic Drainage (MLD) Manual lymph drainage in supine as follows: short neck, left axillary and right inguinal nodes stimulated; anterior inter-axillary and right axillo-inguinal pathway excluding radiated area.  Right upper arm lateral aspect; medial right upper arm redirecting to lateral arm and then redirecting along pathways. Right forearm anterior and posterior and dorsal hand redirecting to right lateral upper arm and then along pathways; ending with right inguinal and axillary nodes and short neck.                PT Education - 07/21/14 1129    Education provided Yes   Education Details Instructed pt on specifics related to getting garments (see pt instructions section)   Person(s) Educated Patient   Methods Explanation;Handout   Comprehension Verbalized understanding           Short Term Clinic Goals - 07/21/14 1143    CC Short Term Goal  #1   Title Patient will be able to report independence with initial home exercise program for neural tension stretching   Time 4   Period Weeks   Status New   CC Short Term Goal  #2   Title Patient will be able to  reduce right arm swelling at area just proximal to ulnar styloid process by >/= 0.4 cm   Time 4   Period Weeks   Status New   CC Short Term Goal  #3   Title Patient will be able to reduce right arm swelling at 10 cm proximal to ulnar styloid process by >/= 0.4 cm   Time 4   Period Weeks   Status New   CC Short Term Goal  #4   Title Patient will be able to verbalize udnerstanding of wear and how to be fitted from a compression sleeve and gauntlet   Time 1   Period Days   Status Achieved   CC Short Term Goal  #5   Title Patient will be able to report >/= 25% reduction in feeling of tightness and discomfort in right arm to toelrate daily tasks with less difficulty           Breast Clinic Goals - 05/04/14 1526    Patient will be able to verbalize understanding of pertinent lymphedema risk reduction practices relevant to her diagnosis specifically related to skin care.   Time 1   Period Days   Status Achieved   Patient will be able to return demonstrate and/or verbalize understanding of the post-op home exercise program related to regaining shoulder range of motion.   Time 1   Period Days   Status Achieved   Patient will be able to verbalize understanding of the importance of attending the postoperative After Breast Cancer Class for further lymphedema risk reduction education and therapeutic exercise.   Time 1   Period Days   Status Achieved          Long Term Clinic Goals - 07/21/14 1145    CC Long Term Goal  #1  Title Patient will be able to verbalize understanding of the Maintenance Phase of treatment including wearing her garments regularly and performing self manual lymph drainage   Time 8   Period Weeks   Status New   CC Long Term Goal  #2   Title Patient will be able to reduce right arm swelling at area just proximal to ulnar styloid process by >/= 0.8 cm   Time 8   Period Weeks   Status New   CC Long Term Goal  #3   Title Patient will be able to reduce right  arm swelling at area 10 cm proximal to ulnar styloid process by >/= 0.8 cm   Time 8   Period Weeks   Status New   CC Long Term Goal  #4   Title Patient will be able to report >/= 50% reduction in feeling of tightness and discomfort in right arm to toelrate daily tasks with less difficulty   Time 8   Period Weeks   Status New            Plan - 07/21/14 1137    Clinical Impression Statement Patient is a very pleasant 44 y.o. [place] File s/p right lumpectomy and sentinel node biopsy who developed right forearm swelling during her last week of radiation 07/16/14.  She reprots pitting edema and discomfort/heaviness in her arm.  Her ROM appears to have returned to baseline although she does have some neural tension in her right UE.  She will benefit from continuous (excluding sleep hours) wear of a compression garment for at least the next 4 weeks to try to reverse her symptoms and will benefit from manual lymph drainage and learning self manual lymph drainage.  She will also benefit from passive neural tension stretching.  Patient is anxious about this new onset of swelling due to her mom dying from complications of a blood clot but the patient has a doppler performed yesterday and it was negative for DVT.  This appears to be mild right arm lymphedema and she should do very well.   Pt will benefit from skilled therapeutic intervention in order to improve on the following deficits Decreased range of motion;Increased edema;Impaired UE functional use;Pain;Decreased strength;Decreased knowledge of precautions   Rehab Potential Excellent   Clinical Impairments Affecting Rehab Potential none   PT Frequency 2x / week   PT Duration 8 weeks   PT Treatment/Interventions Patient/family education;Therapeutic exercise;ADLs/Self Care Home Management;Manual techniques;Manual lymph drainage;DME Instruction;Passive range of motion   PT Next Visit Plan Right arm manual lymph drainage; assess fit of compression garment if  she obtained one   Recommended Other Services Patient already attended ABC class and has a good understanding of lymphedema risk reduction practices   Consulted and Agree with Plan of Care Patient         Problem List Patient Active Problem List   Diagnosis Date Noted  . Genetic testing 05/31/2014  . Eczema, dyshidrotic 05/04/2014  . Hepatic cyst 05/04/2014  . Breast cancer of upper-outer quadrant of right female breast 04/26/2014    Annia Friendly, PT 07/21/2014, 11:56 AM  [place], [place], [address] Phone: [phone]   Fax:  [phone]

## 2014-07-25 ENCOUNTER — Ambulatory Visit: Payer: No Typology Code available for payment source

## 2014-07-25 DIAGNOSIS — C50411 Malignant neoplasm of upper-outer quadrant of right female breast: Secondary | ICD-10-CM | POA: Diagnosis not present

## 2014-07-25 DIAGNOSIS — I890 Lymphedema, not elsewhere classified: Secondary | ICD-10-CM

## 2014-07-25 DIAGNOSIS — M79601 Pain in right arm: Secondary | ICD-10-CM

## 2014-07-25 DIAGNOSIS — E8989 Other postprocedural endocrine and metabolic complications and disorders: Secondary | ICD-10-CM

## 2014-07-25 DIAGNOSIS — Z9189 Other specified personal risk factors, not elsewhere classified: Secondary | ICD-10-CM

## 2014-07-25 NOTE — Therapy (Signed)
[place], [place], [address] Phone: [phone]   Fax:  [phone]  Physical Therapy Treatment  Patient Details  Name: Brooke Ferguson MRN: [id number] Date of Birth: Apr 16, 1970 Referring Provider:  Erroll Luna, MD  Encounter Date: 07/25/2014      PT End of Session - 07/25/14 1158    Visit Number 2   Number of Visits 16   Date for PT Re-Evaluation 09/15/14   PT Start Time 1107   PT Stop Time 0240   PT Time Calculation (min) 46 min      Past Medical History  Diagnosis Date  . Bulging disc     BETWEEN C6-7, HAS HAD 2 CORTISONE INJECTIONS  . 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 Breast cancer of upper-outer quadrant of right female breast 04/26/2014  . History of lumpectomy of right breast 05/17/14    With sentinel node biopsy    Past Surgical History  Procedure Laterality Date  . Hysteroscopy with resectoscope  10/25/2010    Procedure: HYSTEROSCOPY WITH RESECTOSCOPE;  Surgeon: Lovenia Kim, MD;  Location: [facility];  Service: Gynecology;  Laterality: N/A;  Polypectomy  . Wisdom tooth extraction    . Dilation and curettage of uterus    . Diagnostic laparoscopy      OVARIAN CYCTS AT AGE 44 YRS    There were no vitals filed for this visit.  Visit Diagnosis:  Carcinoma of upper-outer quadrant of right female breast  Post-lymphadenectomy lymphedema of arm  Right arm pain  At risk for lymphedema      Subjective Assessment - 07/25/14 1114    Subjective Got my sleeve and gauntlet after last visit and sleeve fits good but the gauntlet feels tight. Cant tell yet if its helping much.   Currently in Pain? No/denies               LYMPHEDEMA/ONCOLOGY QUESTIONNAIRE - 07/25/14 1145    Right Upper Extremity Lymphedema   10 cm Proximal to Olecranon Process 26.1 cm   Olecranon Process 22.9 cm   10 cm Proximal to Ulnar Styloid Process 20.3 cm   Just Proximal to Ulnar  Styloid Process 14.4 cm   Across Hand at [employer] 17.5 cm   At [place] of 2nd Digit 5.7 cm                  [facility] Adult PT Treatment/Exercise - 07/25/14 0001    Manual Therapy   Manual Lymphatic Drainage (MLD) Manual lymph drainage in supine as follows: short neck, left axillary and right inguinal nodes stimulated; anterior inter-axillary and right axillo-inguinal pathway excluding radiated area.  Right upper arm lateral aspect; medial right upper arm redirecting to lateral arm and then redirecting along pathways. Right forearm anterior and posterior and dorsal hand redirecting to right lateral upper arm and then along pathways; ending with right inguinal and axillary nodes instructing pt throughout today.                PT Education - 07/25/14 1157    Education provided Yes   Education Details Self manual lymph drainage   Person(s) Educated Patient   Methods Explanation;Demonstration;Handout   Comprehension Verbalized understanding;Returned demonstration;Need further instruction           Short Term Clinic Goals - 07/25/14 1211    CC Short Term Goal  #1   Title Patient will be able to report independence with initial home  exercise program for neural tension stretching   Status On-going   CC Short Term Goal  #2   Title Patient will be able to reduce right arm swelling at area just proximal to ulnar styloid process by >/= 0.4 cm  1.2 cm reduction today 07/25/14   Status Achieved   CC Short Term Goal  #3   Title Patient will be able to reduce right arm swelling at 10 cm proximal to ulnar styloid process by >/= 0.4 cm  0.8 cm reduction thus far 07/25/14   Status Achieved   CC Short Term Goal  #4   Title Patient will be able to verbalize udnerstanding of wear and how to be fitted from a compression sleeve and gauntlet  Compression sleeve fits well, pt needs to get a better fitting gauntlet   Status Partially Met   CC Short Term Goal  #5   Title Patient will be  able to report >/= 25% reduction in feeling of tightness and discomfort in right arm to toelrate daily tasks with less difficulty   Status On-going           Breast Clinic Goals - 05/04/14 1526    Patient will be able to verbalize understanding of pertinent lymphedema risk reduction practices relevant to her diagnosis specifically related to skin care.   Time 1   Period Days   Status Achieved   Patient will be able to return demonstrate and/or verbalize understanding of the post-op home exercise program related to regaining shoulder range of motion.   Time 1   Period Days   Status Achieved   Patient will be able to verbalize understanding of the importance of attending the postoperative After Breast Cancer Class for further lymphedema risk reduction education and therapeutic exercise.   Time 1   Period Days   Status Achieved          Long Term Clinic Goals - 07/21/14 1145    CC Long Term Goal  #1   Title Patient will be able to verbalize understanding of the Maintenance Phase of treatment including wearing her garments regularly and performing self manual lymph drainage   Time 8   Period Weeks   Status New   CC Long Term Goal  #2   Title Patient will be able to reduce right arm swelling at area just proximal to ulnar styloid process by >/= 0.8 cm   Time 8   Period Weeks   Status New   CC Long Term Goal  #3   Title Patient will be able to reduce right arm swelling at area 10 cm proximal to ulnar styloid process by >/= 0.8 cm   Time 8   Period Weeks   Status New   CC Long Term Goal  #4   Title Patient will be able to report >/= 50% reduction in feeling of tightness and discomfort in right arm to toelrate daily tasks with less difficulty   Time 8   Period Weeks   Status New            Plan - 07/25/14 1158    Clinical Impression Statement Pt comes in wearing her new compression sleeve and gauntlet today. Inspection of these garments shows the sleeve is a good fit  but the gauntlet appears too short not covering the knuckles sufficiently and too tight proximal to the MTP's. I called the fitter and she is aware and will remeasure pt when she comes back in next day or 2.  Pts circumference measurements were taken after manual lymph drainage, which had helped to visibly reduce her dorsal hand swelling from beginning of treatment today, and they were all reduced to pts before measurements.      Pt will benefit from skilled therapeutic intervention in order to improve on the following deficits Decreased range of motion;Increased edema;Impaired UE functional use;Pain;Decreased strength;Decreased knowledge of precautions   Rehab Potential Excellent   Clinical Impairments Affecting Rehab Potential none   PT Frequency 2x / week   PT Duration 8 weeks   PT Treatment/Interventions Patient/family education;Therapeutic exercise;ADLs/Self Care Home Management;Manual techniques;Manual lymph drainage;DME Instruction;Passive range of motion   PT Next Visit Plan Review and continue manual lymph drainage with pt. Assess fit of new gauntlet if she has it by next appt.    PT Home Exercise Plan Self manual lymph drainage   Recommended Other Services Pt to go get fitted for new compression gauntlet   Consulted and Agree with Plan of Care Patient        Problem List Patient Active Problem List   Diagnosis Date Noted  . Genetic testing 05/31/2014  . Eczema, dyshidrotic 05/04/2014  . Hepatic cyst 05/04/2014  . Breast cancer of upper-outer quadrant of right female breast 04/26/2014    Otelia Limes, PTA 07/25/2014, 12:16 PM  [place], [place], [address] Phone: [phone]   Fax:  [phone]

## 2014-07-25 NOTE — Patient Instructions (Signed)

## 2014-07-28 ENCOUNTER — Encounter: Payer: Self-pay | Admitting: Physical Therapy

## 2014-07-28 ENCOUNTER — Ambulatory Visit: Payer: No Typology Code available for payment source | Admitting: Physical Therapy

## 2014-07-28 DIAGNOSIS — E8989 Other postprocedural endocrine and metabolic complications and disorders: Secondary | ICD-10-CM

## 2014-07-28 DIAGNOSIS — C50411 Malignant neoplasm of upper-outer quadrant of right female breast: Secondary | ICD-10-CM | POA: Diagnosis not present

## 2014-07-28 DIAGNOSIS — M79601 Pain in right arm: Secondary | ICD-10-CM

## 2014-07-28 DIAGNOSIS — I890 Lymphedema, not elsewhere classified: Secondary | ICD-10-CM

## 2014-07-28 NOTE — Therapy (Signed)
[place], [place], [address] Phone: [phone]   Fax:  [phone]  Physical Therapy Treatment  Patient Details  Name: Brooke Ferguson MRN: [id number] Date of Birth: 08-07-1970 Referring Provider:  Erroll Luna, MD  Encounter Date: 07/28/2014      PT End of Session - 07/28/14 1153    Visit Number 3   Number of Visits 16   Date for PT Re-Evaluation 09/15/14   PT Start Time 2878   PT Stop Time 1148   PT Time Calculation (min) 50 min   Activity Tolerance Patient tolerated treatment well   Behavior During Therapy [facility] for tasks assessed/performed      Past Medical History  Diagnosis Date  . Bulging disc     BETWEEN C6-7, HAS HAD 2 CORTISONE INJECTIONS  . 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 Breast cancer of upper-outer quadrant of right female breast 04/26/2014  . History of lumpectomy of right breast 05/17/14    With sentinel node biopsy    Past Surgical History  Procedure Laterality Date  . Hysteroscopy with resectoscope  10/25/2010    Procedure: HYSTEROSCOPY WITH RESECTOSCOPE;  Surgeon: Lovenia Kim, MD;  Location: [facility];  Service: Gynecology;  Laterality: N/A;  Polypectomy  . Wisdom tooth extraction    . Dilation and curettage of uterus    . Diagnostic laparoscopy      OVARIAN CYCTS AT AGE 64 YRS    There were no vitals filed for this visit.  Visit Diagnosis:  Carcinoma of upper-outer quadrant of right female breast  Post-lymphadenectomy lymphedema of arm  Right arm pain      Subjective Assessment - 07/28/14 1101    Subjective When she did my manual lymph drainage last time, my arm looked completely normal afterwards.  It stayed "normal" until this morning. I got a new gauntlet and it fits better.   Pertinent History Patient began having tightness in her right arm after surgery but subsided after 1 month.  Then she began 17 days of radiation and during the past  week of radiation she began to have right forearm tightness and her right arm began swelling around 07/15/13.   Patient Stated Goals Reduce swelling and tightness in right arm   Currently in Pain? Yes   Pain Score 2    Pain Location Hand   Pain Orientation Right   Pain Descriptors / Indicators Tender   Pain Type Acute pain   Pain Onset 1 to 4 weeks ago   Aggravating Factors  Swelling   Pain Relieving Factors wearing garment           [facility] Adult PT Treatment/Exercise - 07/28/14 0001    Manual Therapy   Manual Therapy Manual Lymphatic Drainage (MLD)   Manual Lymphatic Drainage (MLD) Manual lymph drainage in supine as follows: short neck, left axillary and right inguinal nodes stimulated; anterior inter-axillary and right axillo-inguinal pathway excluding radiated area.  Right upper arm lateral aspect; medial right upper arm redirecting to lateral arm and then redirecting along pathways. Right forearm anterior and posterior and dorsal hand redirecting to right lateral upper arm and then along pathways; ending with right inguinal and axillary nodes instructing pt throughout today.                PT Education - 07/28/14 1152    Education provided Yes   Education Details Issued DVD instructing with manual lymph drainage to reinforce what  she has already learned   Person(s) Educated Patient   Methods Other (comment)  DVD   Comprehension Verbalized understanding           Short Term Clinic Goals - 07/25/14 1211    CC Short Term Goal  #1   Title Patient will be able to report independence with initial home exercise program for neural tension stretching   Status On-going   CC Short Term Goal  #2   Title Patient will be able to reduce right arm swelling at area just proximal to ulnar styloid process by >/= 0.4 cm  1.2 cm reduction today 07/25/14   Status Achieved   CC Short Term Goal  #3   Title Patient will be able to reduce right arm swelling at 10 cm proximal to ulnar  styloid process by >/= 0.4 cm  0.8 cm reduction thus far 07/25/14   Status Achieved   CC Short Term Goal  #4   Title Patient will be able to verbalize udnerstanding of wear and how to be fitted from a compression sleeve and gauntlet  Compression sleeve fits well, pt needs to get a better fitting gauntlet   Status Partially Met   CC Short Term Goal  #5   Title Patient will be able to report >/= 25% reduction in feeling of tightness and discomfort in right arm to toelrate daily tasks with less difficulty   Status On-going          [facility] Goals - 07/21/14 1145    CC Long Term Goal  #1   Title Patient will be able to verbalize understanding of the Maintenance Phase of treatment including wearing her garments regularly and performing self manual lymph drainage   Time 8   Period Weeks   Status New   CC Long Term Goal  #2   Title Patient will be able to reduce right arm swelling at area just proximal to ulnar styloid process by >/= 0.8 cm   Time 8   Period Weeks   Status New   CC Long Term Goal  #3   Title Patient will be able to reduce right arm swelling at area 10 cm proximal to ulnar styloid process by >/= 0.8 cm   Time 8   Period Weeks   Status New   CC Long Term Goal  #4   Title Patient will be able to report >/= 50% reduction in feeling of tightness and discomfort in right arm to toelrate daily tasks with less difficulty   Time 8   Period Weeks   Status New            Plan - 07/28/14 1153    Clinical Impression Statement Patient's new gauntlet appears to be a much better fit and covers her knuckles.  She appears to be having great results with the manual lymph drainage and wearing compression.  She continues to have swelling present but it appears reduced and tenderness is improved.  Also discussed the support group services available at the [facility] because patient reported feeling tearful recently with concerns about recurrence.  Encouraged her to attend support  group and consider the [facility] class.  She verbalized good understanding and was open to those ideas.   Pt will benefit from skilled therapeutic intervention in order to improve on the following deficits Decreased range of motion;Increased edema;Impaired UE functional use;Pain;Decreased strength;Decreased knowledge of precautions   Rehab Potential Excellent   Clinical Impairments Affecting Rehab Potential none  PT Frequency 2x / week   PT Duration 8 weeks   PT Treatment/Interventions Patient/family education;Therapeutic exercise;ADLs/Self Care Home Management;Manual techniques;Manual lymph drainage;DME Instruction;Passive range of motion   PT Next Visit Plan Issue handout with self manual lymph drainage instruction; continue manual lymph drainage   PT Home Exercise Plan Self manual lymph drainage   Consulted and Agree with Plan of Care Patient        Problem List Patient Active Problem List   Diagnosis Date Noted  . Genetic testing 05/31/2014  . Eczema, dyshidrotic 05/04/2014  . Hepatic cyst 05/04/2014  . Breast cancer of upper-outer quadrant of right female breast 04/26/2014    Annia Friendly, PT 07/28/2014, 11:59 AM  [place], [place], [address] Phone: [phone]   Fax:  [phone]

## 2014-08-02 ENCOUNTER — Encounter: Payer: No Typology Code available for payment source | Admitting: Physical Therapy

## 2014-08-03 ENCOUNTER — Ambulatory Visit: Payer: No Typology Code available for payment source | Attending: Surgery

## 2014-08-03 DIAGNOSIS — Z9189 Other specified personal risk factors, not elsewhere classified: Secondary | ICD-10-CM

## 2014-08-03 DIAGNOSIS — I890 Lymphedema, not elsewhere classified: Secondary | ICD-10-CM | POA: Diagnosis present

## 2014-08-03 DIAGNOSIS — C50411 Malignant neoplasm of upper-outer quadrant of right female breast: Secondary | ICD-10-CM | POA: Diagnosis present

## 2014-08-03 DIAGNOSIS — E8989 Other postprocedural endocrine and metabolic complications and disorders: Secondary | ICD-10-CM | POA: Insufficient documentation

## 2014-08-03 DIAGNOSIS — M79601 Pain in right arm: Secondary | ICD-10-CM | POA: Diagnosis present

## 2014-08-03 NOTE — Therapy (Signed)
[place], [place], [address] Phone: [phone]   Fax:  [phone]  Physical Therapy Treatment  Patient Details  Name: Brooke Ferguson MRN: [id number] Date of Birth: Jul 15, 1970 Referring Provider:  Erroll Luna, MD  Encounter Date: 08/03/2014      PT End of Session - 08/03/14 1015    Visit Number 4   Number of Visits 16   Date for PT Re-Evaluation 09/15/14   PT Start Time 0936   PT Stop Time 6599   PT Time Calculation (min) 38 min      Past Medical History  Diagnosis Date  . Bulging disc     BETWEEN C6-7, HAS HAD 2 CORTISONE INJECTIONS  . 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 Breast cancer of upper-outer quadrant of right female breast 04/26/2014  . History of lumpectomy of right breast 05/17/14    With sentinel node biopsy    Past Surgical History  Procedure Laterality Date  . Hysteroscopy with resectoscope  10/25/2010    Procedure: HYSTEROSCOPY WITH RESECTOSCOPE;  Surgeon: Lovenia Kim, MD;  Location: [facility];  Service: Gynecology;  Laterality: N/A;  Polypectomy  . Wisdom tooth extraction    . Dilation and curettage of uterus    . Diagnostic laparoscopy      OVARIAN CYCTS AT AGE 44 YRS    There were no vitals filed for this visit.  Visit Diagnosis:  Carcinoma of upper-outer quadrant of right female breast  Post-lymphadenectomy lymphedema of arm  Right arm pain  At risk for lymphedema      Subjective Assessment - 08/03/14 0939    Subjective Watched the video, want to watch it a few more times. And did the manual lymph drainage on myself yesterday and felt like I did ok but then my forearm was a little swollen yesterday afternoon.    Currently in Pain? No/denies               LYMPHEDEMA/ONCOLOGY QUESTIONNAIRE - 08/03/14 1011    Right Upper Extremity Lymphedema   10 cm Proximal to Olecranon Process 26.6 cm   Olecranon Process 23.8 cm   10 cm Proximal  to Ulnar Styloid Process 20.4 cm   Just Proximal to Ulnar Styloid Process 14.6 cm   Across Hand at [employer] 17.9 cm   At [place] of 2nd Digit 5.7 cm                  [facility] Adult PT Treatment/Exercise - 08/03/14 0001    Manual Therapy   Manual Lymphatic Drainage (MLD) Manual lymph drainage in supine as follows: short neck, left axillary and right inguinal nodes stimulated; anterior inter-axillary and right axillo-inguinal pathway excluding radiated area.  Right upper arm lateral aspect; medial right upper arm redirecting to lateral arm and then redirecting along pathways. Right forearm anterior and posterior and dorsal hand redirecting to right lateral upper arm and then along pathways; ending with right inguinal and axillary nodes.                   Short Term Clinic Goals - 08/03/14 1019    CC Short Term Goal  #1   Title Patient will be able to report independence with initial home exercise program for neural tension stretching   Status On-going   CC Short Term Goal  #4   Title Patient will be able to verbalize udnerstanding of wear and how to be  fitted from a compression sleeve and gauntlet   Status Achieved           Breast Clinic Goals - 05/04/14 1526    Patient will be able to verbalize understanding of pertinent lymphedema risk reduction practices relevant to her diagnosis specifically related to skin care.   Time 1   Period Days   Status Achieved   Patient will be able to return demonstrate and/or verbalize understanding of the post-op home exercise program related to regaining shoulder range of motion.   Time 1   Period Days   Status Achieved   Patient will be able to verbalize understanding of the importance of attending the postoperative After Breast Cancer Class for further lymphedema risk reduction education and therapeutic exercise.   Time 1   Period Days   Status Achieved          Long Term Clinic Goals - 08/03/14 1019    CC Long Term  Goal  #1   Title Patient will be able to verbalize understanding of the Maintenance Phase of treatment including wearing her garments regularly and performing self manual lymph drainage   Status On-going   CC Long Term Goal  #2   Title Patient will be able to reduce right arm swelling at area just proximal to ulnar styloid process by >/= 0.8 cm   Status On-going   CC Long Term Goal  #3   Title Patient will be able to reduce right arm swelling at area 10 cm proximal to ulnar styloid process by >/= 0.8 cm   Status On-going   CC Long Term Goal  #4   Title Patient will be able to report >/= 50% reduction in feeling of tightness and discomfort in right arm to toelrate daily tasks with less difficulty   Status On-going            Plan - 08/03/14 1015    Clinical Impression Statement Pt is making good progress with Maintenance Phase of treatment and being compliant with wearing compression garments reporting comfortable fit. Pt reports has handout for self manual lymph drainage and will return video after watching it a few more times, also had her husband watch it with her as well. Some increse in measurements from last week, but none were higher than measurements taken 2 weeks ago. And pt cam ein with dorsal hand visibly swollen and this was reduced after treatment today.    Pt will benefit from skilled therapeutic intervention in order to improve on the following deficits Decreased range of motion;Increased edema;Impaired UE functional use;Pain;Decreased strength;Decreased knowledge of precautions   Rehab Potential Excellent   Clinical Impairments Affecting Rehab Potential none   PT Frequency 2x / week   PT Duration 8 weeks   PT Treatment/Interventions Patient/family education;Therapeutic exercise;ADLs/Self Care Home Management;Manual techniques;Manual lymph drainage;DME Instruction;Passive range of motion   PT Next Visit Plan Continue manual lymph drainage and instruct in HEP for neural  tension stretch for goal.    PT Home Exercise Plan Self manual lymph drainage   Consulted and Agree with Plan of Care Patient        Problem List Patient Active Problem List   Diagnosis Date Noted  . Genetic testing 05/31/2014  . Eczema, dyshidrotic 05/04/2014  . Hepatic cyst 05/04/2014  . Breast cancer of upper-outer quadrant of right female breast 04/26/2014    Otelia Limes, PTA 08/03/2014, 10:20 AM  [place], [place], [address]  Phone: [phone]   Fax:  [phone]

## 2014-08-04 ENCOUNTER — Ambulatory Visit: Payer: No Typology Code available for payment source

## 2014-08-05 ENCOUNTER — Ambulatory Visit: Payer: No Typology Code available for payment source | Admitting: Physical Therapy

## 2014-08-05 DIAGNOSIS — E8989 Other postprocedural endocrine and metabolic complications and disorders: Secondary | ICD-10-CM

## 2014-08-05 DIAGNOSIS — I890 Lymphedema, not elsewhere classified: Principal | ICD-10-CM

## 2014-08-05 DIAGNOSIS — C50411 Malignant neoplasm of upper-outer quadrant of right female breast: Secondary | ICD-10-CM | POA: Diagnosis not present

## 2014-08-05 DIAGNOSIS — M79601 Pain in right arm: Secondary | ICD-10-CM

## 2014-08-05 NOTE — Therapy (Signed)
[place], [place], [address] Phone: [phone]   Fax:  [phone]  Physical Therapy Treatment  Patient Details  Name: Brooke Ferguson MRN: [id number] Date of Birth: 1970-11-07 Referring Provider:  Erroll Luna, MD  Encounter Date: 08/05/2014      PT End of Session - 08/05/14 1206    Visit Number 5   Number of Visits 16   Date for PT Re-Evaluation 09/15/14   PT Start Time 0848   PT Stop Time 0934   PT Time Calculation (min) 46 min   Activity Tolerance Patient tolerated treatment well   Behavior During Therapy [facility] for tasks assessed/performed      Past Medical History  Diagnosis Date  . Bulging disc     BETWEEN C6-7, HAS HAD 2 CORTISONE INJECTIONS  . 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 Breast cancer of upper-outer quadrant of right female breast 04/26/2014  . History of lumpectomy of right breast 05/17/14    With sentinel node biopsy    Past Surgical History  Procedure Laterality Date  . Hysteroscopy with resectoscope  10/25/2010    Procedure: HYSTEROSCOPY WITH RESECTOSCOPE;  Surgeon: Lovenia Kim, MD;  Location: [facility];  Service: Gynecology;  Laterality: N/A;  Polypectomy  . Wisdom tooth extraction    . Dilation and curettage of uterus    . Diagnostic laparoscopy      OVARIAN CYCTS AT AGE 54 YRS    There were no vitals filed for this visit.  Visit Diagnosis:  Post-lymphadenectomy lymphedema of arm  Right arm pain      Subjective Assessment - 08/05/14 0855    Subjective Nothing new.  Returned the video.   Currently in Pain? Yes   Pain Score 4    Pain Location Hand   Pain Orientation Right;Lateral   Pain Descriptors / Indicators Other (Comment)  sensitivity   Aggravating Factors  unsure   Pain Relieving Factors unsure                         [facility] Adult PT Treatment/Exercise - 08/05/14 0001    Exercises   Exercises Other Exercises   Other Exercises  Right UE neural tension stretch instructed and performed by patient with hand against wall.   Manual Therapy   Manual Lymphatic Drainage (MLD) Manual lymph drainage in supine as follows: short neck, left axillary and right inguinal nodes stimulated; anterior inter-axillary and right axillo-inguinal pathway excluding radiated area.  Right upper arm lateral aspect; medial right upper arm redirecting to lateral arm and then redirecting along pathways. Right forearm anterior and posterior and dorsal hand redirecting to right lateral upper arm and then along pathways; ending with right inguinal and axillary nodes.  Also in left sidelying, posterior interaxillary anastomosis right to left and right axillo-inguinal anastomosis.                PT Education - 08/05/14 1206    Education provided Yes   Education Details Right UE neural tension stretch.   Person(s) Educated Patient   Methods Explanation;Demonstration;Handout   Comprehension Verbalized understanding;Returned demonstration           Short Term Clinic Goals - 08/03/14 1019    CC Short Term Goal  #1   Title Patient will be able to report independence with initial home exercise program for neural tension stretching   Status On-going   CC Short Term Goal  #  4   Title Patient will be able to verbalize udnerstanding of wear and how to be fitted from a compression sleeve and gauntlet   Status Achieved           Breast Clinic Goals - 05/04/14 1526    Patient will be able to verbalize understanding of pertinent lymphedema risk reduction practices relevant to her diagnosis specifically related to skin care.   Time 1   Period Days   Status Achieved   Patient will be able to return demonstrate and/or verbalize understanding of the post-op home exercise program related to regaining shoulder range of motion.   Time 1   Period Days   Status Achieved   Patient will be able to verbalize understanding of the  importance of attending the postoperative After Breast Cancer Class for further lymphedema risk reduction education and therapeutic exercise.   Time 1   Period Days   Status Achieved          Long Term Clinic Goals - 08/03/14 1019    CC Long Term Goal  #1   Title Patient will be able to verbalize understanding of the Maintenance Phase of treatment including wearing her garments regularly and performing self manual lymph drainage   Status On-going   CC Long Term Goal  #2   Title Patient will be able to reduce right arm swelling at area just proximal to ulnar styloid process by >/= 0.8 cm   Status On-going   CC Long Term Goal  #3   Title Patient will be able to reduce right arm swelling at area 10 cm proximal to ulnar styloid process by >/= 0.8 cm   Status On-going   CC Long Term Goal  #4   Title Patient will be able to report >/= 50% reduction in feeling of tightness and discomfort in right arm to toelrate daily tasks with less difficulty   Status On-going            Plan - 08/05/14 1207    Clinical Impression Statement Continues to respond well to treatment.  Asked appropriate questions today.     Pt will benefit from skilled therapeutic intervention in order to improve on the following deficits Decreased range of motion;Increased edema;Impaired UE functional use;Pain;Decreased strength;Decreased knowledge of precautions   Rehab Potential Excellent   PT Treatment/Interventions Manual lymph drainage;Therapeutic exercise;Patient/family education   PT Next Visit Plan Continue manual lymph drainage and instruction toward independent self-care.   Consulted and Agree with Plan of Care Patient        Problem List Patient Active Problem List   Diagnosis Date Noted  . Genetic testing 05/31/2014  . Eczema, dyshidrotic 05/04/2014  . Hepatic cyst 05/04/2014  . Breast cancer of upper-outer quadrant of right female breast 04/26/2014    Jigar Zielke 08/05/2014, 12:10 PM  [place], [place], [address] Phone: [phone]   Fax:  [place], PT 08/05/2014 12:11 PM

## 2014-08-05 NOTE — Patient Instructions (Signed)
Stand with right arm fairly straight out to the side and your hand on the wall, fingers facing back. Bend elbow slightly, then straighten, so that you feel a stretch in the arm when you straighten it. Do this bend/ straighten fairly quickly, about 30 repetitions. Do this a couple of times, and do it twice a day if you can.

## 2014-08-08 ENCOUNTER — Encounter: Payer: Self-pay | Admitting: Physical Therapy

## 2014-08-08 ENCOUNTER — Ambulatory Visit: Payer: No Typology Code available for payment source | Admitting: Physical Therapy

## 2014-08-08 DIAGNOSIS — C50411 Malignant neoplasm of upper-outer quadrant of right female breast: Secondary | ICD-10-CM | POA: Diagnosis not present

## 2014-08-08 DIAGNOSIS — E8989 Other postprocedural endocrine and metabolic complications and disorders: Secondary | ICD-10-CM

## 2014-08-08 DIAGNOSIS — I890 Lymphedema, not elsewhere classified: Principal | ICD-10-CM

## 2014-08-08 NOTE — Therapy (Signed)
[place], [place], [address] Phone: [phone]   Fax:  [phone]  Physical Therapy Treatment  Patient Details  Name: Brooke Ferguson MRN: [id number] Date of Birth: 1970/12/07 Referring Provider:  Erroll Luna, MD  Encounter Date: 08/08/2014      PT End of Session - 08/08/14 1348    Visit Number 6   Number of Visits 16   Date for PT Re-Evaluation 09/15/14   PT Start Time 1300   PT Stop Time 6270   PT Time Calculation (min) 47 min   Activity Tolerance Patient tolerated treatment well   Behavior During Therapy [facility] for tasks assessed/performed      Past Medical History  Diagnosis Date  . Bulging disc     BETWEEN C6-7, HAS HAD 2 CORTISONE INJECTIONS  . 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 Breast cancer of upper-outer quadrant of right female breast 04/26/2014  . History of lumpectomy of right breast 05/17/14    With sentinel node biopsy    Past Surgical History  Procedure Laterality Date  . Hysteroscopy with resectoscope  10/25/2010    Procedure: HYSTEROSCOPY WITH RESECTOSCOPE;  Surgeon: Lovenia Kim, MD;  Location: [facility];  Service: Gynecology;  Laterality: N/A;  Polypectomy  . Wisdom tooth extraction    . Dilation and curettage of uterus    . Diagnostic laparoscopy      OVARIAN CYCTS AT AGE 41 YRS    There were no vitals filed for this visit.  Visit Diagnosis:  Post-lymphadenectomy lymphedema of arm  Carcinoma of upper-outer quadrant of right female breast      Subjective Assessment - 08/08/14 1344    Subjective Watched the video and tried the massage.  I did fine with that.     Pertinent History Patient began having tightness in her right arm after surgery but subsided after 1 month.  Then she began 17 days of radiation and during the past week of radiation she began to have right forearm tightness and her right arm began swelling around 07/15/13.   Patient Stated  Goals Reduce swelling and tightness in right arm   Currently in Pain? No/denies             [facility] Adult PT Treatment/Exercise - 08/08/14 0001    Manual Therapy   Manual Lymphatic Drainage (MLD) Manual lymph drainage in supine as follows: short neck, left axillary and right inguinal nodes stimulated; anterior inter-axillary and right axillo-inguinal pathway excluding radiated area.  Right upper arm lateral aspect; medial right upper arm redirecting to lateral arm and then redirecting along pathways. Right forearm anterior and posterior and dorsal hand redirecting to right lateral upper arm and then along pathways; ending with right inguinal and axillary nodes.              Short Term Clinic Goals - 08/03/14 1019    CC Short Term Goal  #1   Title Patient will be able to report independence with initial home exercise program for neural tension stretching   Status On-going   CC Short Term Goal  #4   Title Patient will be able to verbalize udnerstanding of wear and how to be fitted from a compression sleeve and gauntlet   Status Achieved          Long Term Clinic Goals - 08/03/14 1019    CC Long Term Goal  #1   Title Patient will be able to verbalize  understanding of the Maintenance Phase of treatment including wearing her garments regularly and performing self manual lymph drainage   Status On-going   CC Long Term Goal  #2   Title Patient will be able to reduce right arm swelling at area just proximal to ulnar styloid process by >/= 0.8 cm   Status On-going   CC Long Term Goal  #3   Title Patient will be able to reduce right arm swelling at area 10 cm proximal to ulnar styloid process by >/= 0.8 cm   Status On-going   CC Long Term Goal  #4   Title Patient will be able to report >/= 50% reduction in feeling of tightness and discomfort in right arm to toelrate daily tasks with less difficulty   Status On-going            Plan - 08/08/14 1657    Clinical Impression  Statement Today patient appears to have slightly decreased swelling in right forearm but slightly increased in dorsal hand.  Discussed the option of a glove instead of a gauntlet and a night time compression garment.  She liked the idea of that and agreed to have information sent to [employer] (fitter) to begin authorization process for getting garments.  She will benefit from continued therapy to further reduce swelling.   Pt will benefit from skilled therapeutic intervention in order to improve on the following deficits Decreased range of motion;Increased edema;Impaired UE functional use;Pain;Decreased strength;Decreased knowledge of precautions   Rehab Potential Excellent   Clinical Impairments Affecting Rehab Potential none   PT Frequency 2x / week   PT Duration 8 weeks   PT Treatment/Interventions Manual lymph drainage;Therapeutic exercise;Patient/family education   PT Next Visit Plan Continue manual lymph drainage and remeasure.   PT Home Exercise Plan Self manual lymph drainage   Consulted and Agree with Plan of Care Patient        Problem List Patient Active Problem List   Diagnosis Date Noted  . Genetic testing 05/31/2014  . Eczema, dyshidrotic 05/04/2014  . Hepatic cyst 05/04/2014  . Breast cancer of upper-outer quadrant of right female breast 04/26/2014    Annia Friendly, PT 08/08/2014, 5:00 PM  [place], [place], [address] Phone: [phone]   Fax:  [phone]

## 2014-08-11 ENCOUNTER — Encounter: Payer: Self-pay | Admitting: Physical Therapy

## 2014-08-11 ENCOUNTER — Ambulatory Visit: Payer: No Typology Code available for payment source | Admitting: Physical Therapy

## 2014-08-11 DIAGNOSIS — I890 Lymphedema, not elsewhere classified: Principal | ICD-10-CM

## 2014-08-11 DIAGNOSIS — E8989 Other postprocedural endocrine and metabolic complications and disorders: Secondary | ICD-10-CM

## 2014-08-11 DIAGNOSIS — C50411 Malignant neoplasm of upper-outer quadrant of right female breast: Secondary | ICD-10-CM | POA: Diagnosis not present

## 2014-08-11 NOTE — Therapy (Signed)
[place], [place], [address] Phone: [phone]   Fax:  [phone]  Physical Therapy Treatment  Patient Details  Name: Brooke Ferguson MRN: [id number] Date of Birth: 10-08-1970 Referring Provider:  Erroll Luna, MD  Encounter Date: 08/11/2014      PT End of Session - 08/11/14 1145    Visit Number 7   Number of Visits 16   Date for PT Re-Evaluation 09/15/14   PT Start Time 3818   PT Stop Time 1100   PT Time Calculation (min) 45 min   Activity Tolerance Patient tolerated treatment well   Behavior During Therapy [facility] for tasks assessed/performed      Past Medical History  Diagnosis Date  . Bulging disc     BETWEEN C6-7, HAS HAD 2 CORTISONE INJECTIONS  . 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 Breast cancer of upper-outer quadrant of right female breast 04/26/2014  . History of lumpectomy of right breast 05/17/14    With sentinel node biopsy    Past Surgical History  Procedure Laterality Date  . Hysteroscopy with resectoscope  10/25/2010    Procedure: HYSTEROSCOPY WITH RESECTOSCOPE;  Surgeon: Lovenia Kim, MD;  Location: [facility];  Service: Gynecology;  Laterality: N/A;  Polypectomy  . Wisdom tooth extraction    . Dilation and curettage of uterus    . Diagnostic laparoscopy      OVARIAN CYCTS AT AGE 34 YRS    There were no vitals filed for this visit.  Visit Diagnosis:  Post-lymphadenectomy lymphedema of arm      Subjective Assessment - 08/11/14 1018    Subjective My hand seems almost normal and I wonder if I even need to wear these garments.   Pertinent History Patient began having tightness in her right arm after surgery but subsided after 1 month.  Then she began 17 days of radiation and during the past week of radiation she began to have right forearm tightness and her right arm began swelling around 07/15/13.   Patient Stated Goals Reduce swelling and tightness in right  arm   Currently in Pain? No/denies               LYMPHEDEMA/ONCOLOGY QUESTIONNAIRE - 08/11/14 1019    Right Upper Extremity Lymphedema   10 cm Proximal to Olecranon Process 26.5 cm   Olecranon Process 23.6 cm   10 cm Proximal to Ulnar Styloid Process 20.5 cm   Just Proximal to Ulnar Styloid Process 14.8 cm   Across Hand at [employer] 18 cm   At [place] of 2nd Digit 5.7 cm                  [facility] Adult PT Treatment/Exercise - 08/11/14 0001    Manual Therapy   Manual Therapy Manual Lymphatic Drainage (MLD)   Manual Lymphatic Drainage (MLD) Manual lymph drainage in supine as follows: short neck, left axillary and right inguinal nodes stimulated; anterior inter-axillary and right axillo-inguinal pathway excluding radiated area.  Right upper arm lateral aspect; medial right upper arm redirecting to lateral arm and then redirecting along pathways. Right forearm anterior and posterior and dorsal hand redirecting to right lateral upper arm and then along pathways; ending with right inguinal and axillary nodes.                     Short Term Clinic Goals - 08/03/14 1019    CC Short Term Goal  #  1   Title Patient will be able to report independence with initial home exercise program for neural tension stretching   Status On-going   CC Short Term Goal  #4   Title Patient will be able to verbalize udnerstanding of wear and how to be fitted from a compression sleeve and gauntlet   Status Achieved           Long Term Clinic Goals - 08/03/14 1019    CC Long Term Goal  #1   Title Patient will be able to verbalize understanding of the Maintenance Phase of treatment including wearing her garments regularly and performing self manual lymph drainage   Status On-going   CC Long Term Goal  #2   Title Patient will be able to reduce right arm swelling at area just proximal to ulnar styloid process by >/= 0.8 cm   Status On-going   CC Long Term Goal  #3   Title Patient  will be able to reduce right arm swelling at area 10 cm proximal to ulnar styloid process by >/= 0.8 cm   Status On-going   CC Long Term Goal  #4   Title Patient will be able to report >/= 50% reduction in feeling of tightness and discomfort in right arm to toelrate daily tasks with less difficulty   Status On-going            Plan - 08/11/14 1145    Clinical Impression Statement Patient has visible improvement in her swelling today. She is pleased with her progress and questioned the need to continue wearing her garments. She was encouraged to wear her garments for another 2 weeks.   Pt will benefit from skilled therapeutic intervention in order to improve on the following deficits Decreased range of motion;Increased edema;Impaired UE functional use;Pain;Decreased strength;Decreased knowledge of precautions   Rehab Potential Excellent   Clinical Impairments Affecting Rehab Potential none   PT Frequency 2x / week   PT Duration 8 weeks   PT Treatment/Interventions Manual lymph drainage;Therapeutic exercise;Patient/family education   PT Next Visit Plan Continue manual lymph drainage and remeasure.   PT Home Exercise Plan Self manual lymph drainage   Consulted and Agree with Plan of Care Patient        Problem List Patient Active Problem List   Diagnosis Date Noted  . Genetic testing 05/31/2014  . Eczema, dyshidrotic 05/04/2014  . Hepatic cyst 05/04/2014  . Breast cancer of upper-outer quadrant of right female breast 04/26/2014    Annia Friendly, PT 08/11/2014, 12:03 PM  [place], [place], [address] Phone: [phone]   Fax:  [phone]

## 2014-08-15 ENCOUNTER — Ambulatory Visit: Payer: No Typology Code available for payment source | Admitting: Physical Therapy

## 2014-08-15 ENCOUNTER — Encounter: Payer: Self-pay | Admitting: Physical Therapy

## 2014-08-15 DIAGNOSIS — C50411 Malignant neoplasm of upper-outer quadrant of right female breast: Secondary | ICD-10-CM | POA: Diagnosis not present

## 2014-08-15 DIAGNOSIS — E8989 Other postprocedural endocrine and metabolic complications and disorders: Secondary | ICD-10-CM

## 2014-08-15 DIAGNOSIS — I890 Lymphedema, not elsewhere classified: Principal | ICD-10-CM

## 2014-08-15 NOTE — Therapy (Signed)
[place], [place], [address] Phone: [phone]   Fax:  [phone]  Physical Therapy Treatment  Patient Details  Name: Brooke Ferguson MRN: [id number] Date of Birth: 09-01-1970 Referring Provider:  Erroll Luna, MD  Encounter Date: 08/15/2014      PT End of Session - 08/15/14 1421    Visit Number 8   Number of Visits 16   Date for PT Re-Evaluation 09/15/14   PT Start Time 6378   PT Stop Time 1424   PT Time Calculation (min) 41 min   Activity Tolerance Patient tolerated treatment well   Behavior During Therapy [facility] for tasks assessed/performed      Past Medical History  Diagnosis Date  . Bulging disc     BETWEEN C6-7, HAS HAD 2 CORTISONE INJECTIONS  . 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 Breast cancer of upper-outer quadrant of right female breast 04/26/2014  . History of lumpectomy of right breast 05/17/14    With sentinel node biopsy    Past Surgical History  Procedure Laterality Date  . Hysteroscopy with resectoscope  10/25/2010    Procedure: HYSTEROSCOPY WITH RESECTOSCOPE;  Surgeon: Lovenia Kim, MD;  Location: [facility];  Service: Gynecology;  Laterality: N/A;  Polypectomy  . Wisdom tooth extraction    . Dilation and curettage of uterus    . Diagnostic laparoscopy      OVARIAN CYCTS AT AGE 30 YRS    There were no vitals filed for this visit.  Visit Diagnosis:  Post-lymphadenectomy lymphedema of arm      Subjective Assessment - 08/15/14 1346    Subjective It seems about the same.  I got a call and I'm meeting the fitter here Friday to get measured for a glove and a night time garment.   Pertinent History Patient began having tightness in her right arm after surgery but subsided after 1 month.  Then she began 17 days of radiation and during the past week of radiation she began to have right forearm tightness and her right arm began swelling around 07/15/13.   Patient  Stated Goals Reduce swelling and tightness in right arm   Currently in Pain? No/denies           [facility] Adult PT Treatment/Exercise - 08/15/14 0001    Manual Therapy   Manual Therapy Manual Lymphatic Drainage (MLD)   Manual Lymphatic Drainage (MLD) Manual lymph drainage in supine as follows: short neck, left axillary and right inguinal nodes stimulated; anterior inter-axillary and right axillo-inguinal pathway excluding radiated area.  Right upper arm lateral aspect; medial right upper arm redirecting to lateral arm and then redirecting along pathways. Right forearm anterior and posterior and dorsal hand redirecting to right lateral upper arm and then along pathways; ending with right inguinal and axillary nodes.             Short Term Clinic Goals - 08/03/14 1019    CC Short Term Goal  #1   Title Patient will be able to report independence with initial home exercise program for neural tension stretching   Status On-going   CC Short Term Goal  #4   Title Patient will be able to verbalize udnerstanding of wear and how to be fitted from a compression sleeve and gauntlet   Status Achieved           Breast Clinic Goals - 05/04/14 1526    Patient will be able to verbalize  understanding of pertinent lymphedema risk reduction practices relevant to her diagnosis specifically related to skin care.   Time 1   Period Days   Status Achieved   Patient will be able to return demonstrate and/or verbalize understanding of the post-op home exercise program related to regaining shoulder range of motion.   Time 1   Period Days   Status Achieved   Patient will be able to verbalize understanding of the importance of attending the postoperative After Breast Cancer Class for further lymphedema risk reduction education and therapeutic exercise.   Time 1   Period Days   Status Achieved          Long Term Clinic Goals - 08/03/14 1019    CC Long Term Goal  #1   Title Patient will be able to  verbalize understanding of the Maintenance Phase of treatment including wearing her garments regularly and performing self manual lymph drainage   Status On-going   CC Long Term Goal  #2   Title Patient will be able to reduce right arm swelling at area just proximal to ulnar styloid process by >/= 0.8 cm   Status On-going   CC Long Term Goal  #3   Title Patient will be able to reduce right arm swelling at area 10 cm proximal to ulnar styloid process by >/= 0.8 cm   Status On-going   CC Long Term Goal  #4   Title Patient will be able to report >/= 50% reduction in feeling of tightness and discomfort in right arm to toelrate daily tasks with less difficulty   Status On-going            Plan - 08/15/14 1422    Clinical Impression Statement Patient's hand appears to be much improved.  She still has visible swelling but it is improved.  She is scheduled to be fitted for a compression sleeve and possibly a night time garment on 08/19/14.   Pt will benefit from skilled therapeutic intervention in order to improve on the following deficits Decreased range of motion;Increased edema;Impaired UE functional use;Pain;Decreased strength;Decreased knowledge of precautions   Rehab Potential Excellent   Clinical Impairments Affecting Rehab Potential none   PT Frequency 2x / week   PT Duration 8 weeks   PT Treatment/Interventions Manual lymph drainage;Therapeutic exercise;Patient/family education   PT Next Visit Plan Complete DASH, do manual lymph drainage, remeasure and D/C   PT Home Exercise Plan Self manual lymph drainage   Consulted and Agree with Plan of Care Patient        Problem List Patient Active Problem List   Diagnosis Date Noted  . Genetic testing 05/31/2014  . Eczema, dyshidrotic 05/04/2014  . Hepatic cyst 05/04/2014  . Breast cancer of upper-outer quadrant of right female breast 04/26/2014    Annia Friendly, PT 08/15/2014, 2:30 PM  [place], [place], [address] Phone: [phone]   Fax:  [phone]

## 2014-08-18 ENCOUNTER — Ambulatory Visit: Payer: No Typology Code available for payment source

## 2014-08-18 ENCOUNTER — Encounter: Payer: Self-pay | Admitting: Radiation Oncology

## 2014-08-18 DIAGNOSIS — Z9189 Other specified personal risk factors, not elsewhere classified: Secondary | ICD-10-CM

## 2014-08-18 DIAGNOSIS — M79601 Pain in right arm: Secondary | ICD-10-CM

## 2014-08-18 DIAGNOSIS — C50411 Malignant neoplasm of upper-outer quadrant of right female breast: Secondary | ICD-10-CM | POA: Diagnosis not present

## 2014-08-18 DIAGNOSIS — I890 Lymphedema, not elsewhere classified: Principal | ICD-10-CM

## 2014-08-18 DIAGNOSIS — E8989 Other postprocedural endocrine and metabolic complications and disorders: Secondary | ICD-10-CM

## 2014-08-18 NOTE — Therapy (Addendum)
[place], [place], [address] Phone: [phone]   Fax:  [phone]  Physical Therapy Treatment  Patient Details  Name: Brooke Ferguson MRN: [id number] Date of Birth: 28-Sep-1970 Referring Provider:  Erroll Luna, MD  Encounter Date: 08/18/2014      PT End of Session - 08/18/14 1151    Visit Number 9   Number of Visits 16   Date for PT Re-Evaluation 09/15/14   PT Start Time 1109   PT Stop Time 1154   PT Time Calculation (min) 45 min   Activity Tolerance Patient tolerated treatment well   Behavior During Therapy [facility] for tasks assessed/performed      Past Medical History  Diagnosis Date  . Bulging disc     BETWEEN C6-7, HAS HAD 2 CORTISONE INJECTIONS  . 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 Breast cancer of upper-outer quadrant of right female breast 04/26/2014  . History of lumpectomy of right breast 05/17/14    With sentinel node biopsy    Past Surgical History  Procedure Laterality Date  . Hysteroscopy with resectoscope  10/25/2010    Procedure: HYSTEROSCOPY WITH RESECTOSCOPE;  Surgeon: Lovenia Kim, MD;  Location: [facility];  Service: Gynecology;  Laterality: N/A;  Polypectomy  . Wisdom tooth extraction    . Dilation and curettage of uterus    . Diagnostic laparoscopy      OVARIAN CYCTS AT AGE 54 YRS    There were no vitals filed for this visit.  Visit Diagnosis:  Post-lymphadenectomy lymphedema of arm  Carcinoma of upper-outer quadrant of right female breast  Right arm pain  At risk for lymphedema             LYMPHEDEMA/ONCOLOGY QUESTIONNAIRE - 08/18/14 1153    Right Upper Extremity Lymphedema   10 cm Proximal to Olecranon Process 26.5 cm  Taken after manual lymph drainage   Olecranon Process 23.3 cm   10 cm Proximal to Ulnar Styloid Process 20.6 cm   Just Proximal to Ulnar Styloid Process 14.5 cm   Across Hand at [employer] 17.8 cm   At [place] of  2nd Digit 5.7 cm           Quick Dash - 08/18/14 0001    Open a tight or new jar Mild difficulty   Do heavy household chores (wash walls, wash floors) No difficulty   Carry a shopping bag or briefcase No difficulty   Wash your back Mild difficulty   Use a knife to cut food No difficulty   Recreational activities in which you take some force or impact through your arm, shoulder, or hand (golf, hammering, tennis) No difficulty   During the past week, to what extent has your arm, shoulder or hand problem interfered with your normal social activities with family, friends, neighbors, or groups? Not at all   During the past week, to what extent has your arm, shoulder or hand problem limited your work or other regular daily activities Not at all   Arm, shoulder, or hand pain. Mild   Tingling (pins and needles) in your arm, shoulder, or hand None   Difficulty Sleeping No difficulty   DASH Score 6.82 %               [facility] Adult PT Treatment/Exercise - 08/18/14 0001    Manual Therapy   Manual Lymphatic Drainage (MLD) Manual lymph drainage in supine as follows: short neck, left  axillary and right inguinal nodes stimulated; anterior inter-axillary and right axillo-inguinal pathway excluding radiated area.  Right upper arm lateral aspect; medial right upper arm redirecting to lateral arm and then redirecting along pathways. Right forearm anterior and posterior and dorsal hand redirecting to right lateral upper arm and then along pathways; ending with right inguinal and axillary nodes.                     Short Term Clinic Goals - 08/03/14 1019    CC Short Term Goal  #1   Title Patient will be able to report independence with initial home exercise program for neural tension stretching   Status On-going   CC Short Term Goal  #4   Title Patient will be able to verbalize udnerstanding of wear and how to be fitted from a compression sleeve and gauntlet   Status Achieved            Breast Clinic Goals - 05/04/14 1526    Patient will be able to verbalize understanding of pertinent lymphedema risk reduction practices relevant to her diagnosis specifically related to skin care.   Time 1   Period Days   Status Achieved   Patient will be able to return demonstrate and/or verbalize understanding of the post-op home exercise program related to regaining shoulder range of motion.   Time 1   Period Days   Status Achieved   Patient will be able to verbalize understanding of the importance of attending the postoperative After Breast Cancer Class for further lymphedema risk reduction education and therapeutic exercise.   Time 1   Period Days   Status Achieved          Long Term Clinic Goals - 08/18/14 1152    CC Long Term Goal  #1   Title Patient will be able to verbalize understanding of the Maintenance Phase of treatment including wearing her garments regularly and performing self manual lymph drainage   Status Achieved   CC Long Term Goal  #4   Title Patient will be able to report >/= 50% reduction in feeling of tightness and discomfort in right arm to toelrate daily tasks with less difficulty  Pt reports 80% improvement 08/18/15   Status Achieved            Plan - 08/18/14 1201    Clinical Impression Statement Patient has made great gains with her circumference reductions and DASH scor improved from 27% limited to 6.82%. All goals met, pt discharged today. And she is getting measured for her nighttime garment tomotrrow.    Pt will benefit from skilled therapeutic intervention in order to improve on the following deficits Decreased range of motion;Increased edema;Impaired UE functional use;Pain;Decreased strength;Decreased knowledge of precautions   Rehab Potential Excellent   Clinical Impairments Affecting Rehab Potential none   PT Frequency 2x / week   PT Duration 8 weeks   PT Treatment/Interventions Manual lymph drainage;Therapeutic exercise;Patient/family  education   PT Next Visit Plan [place] visit.    PT Home Exercise Plan Self manual lymph drainage   Recommended Other Services Pt getting fitted for nighttime garment tomorrow, 08/19/15.   Consulted and Agree with Plan of Care Patient        Problem List Patient Active Problem List   Diagnosis Date Noted  . Genetic testing 05/31/2014  . Eczema, dyshidrotic 05/04/2014  . Hepatic cyst 05/04/2014  . Breast cancer of upper-outer quadrant of right female breast 04/26/2014    Collie Siad  Lelon Frohlich, PTA 08/18/2014, 12:10 PM  [place], [place], [address] Phone: [phone]   Fax:  [phone]   PHYSICAL THERAPY DISCHARGE SUMMARY  Visits from Start of Care: 9  Current functional level related to goals / functional outcomes: Patient has met all goals as indicated above.   Remaining deficits: She continues to have mild swelling in her right upper extremity.  She has been educated on performing self manual lymph drainage and the importance of wearing her compression garments for Maintenance of her reduction in swelling.     Education / Equipment: Compression sleeve and glove/gauntlet; night time compression garment.  Plan: Patient agrees to discharge.  Patient goals were met. Patient is being discharged due to meeting the stated rehab goals.  ?????       Annia Friendly, [place] 08/24/2014 9:11 AM

## 2014-08-23 ENCOUNTER — Ambulatory Visit
Admission: RE | Admit: 2014-08-23 | Discharge: 2014-08-23 | Disposition: A | Discharge status: Home / Self Care | Payer: PRIVATE HEALTH INSURANCE | Source: Ambulatory Visit | Attending: Radiation Oncology | Admitting: Radiation Oncology

## 2014-08-23 ENCOUNTER — Encounter: Payer: Self-pay | Admitting: Radiation Oncology

## 2014-08-23 VITALS — BP 129/90 | HR 77 | Temp 98.7°F | Ht 66.0 in | Wt 180.6 lb

## 2014-08-23 DIAGNOSIS — C50411 Malignant neoplasm of upper-outer quadrant of right female breast: Secondary | ICD-10-CM

## 2014-08-23 HISTORY — DX: Personal history of irradiation: Z92.3

## 2014-08-23 NOTE — Progress Notes (Signed)
Ms. Coyle here for reassessment s/p radiation to her right breast.  Reports that "immediatley after the end of XRT she developed Lymphedema and is now wearing a sleeve.  Denies pain in tx field.  Mild redness of breast.

## 2014-08-23 NOTE — Progress Notes (Signed)
CC: Dr. Erroll Luna, Dr. 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  History: Ms. Marando returns today approximately 5 weeks following completion of radiation therapy following conservative surgery in the management of her T1 N0 invasive ductal carcinoma the right breast.  Following radiation therapy she noted "tightness" of her right upper extremity which became acutely worse.  She contacted Dr. Brantley Stage and was scheduled for a Doppler study with no evidence for thrombosis.  She is now seeing physical therapy for management of her lymphedema.  She tells me she will see Dr. Jana Hakim next week for discussion of adjuvant tamoxifen.   Physical examination: Alert and oriented. Filed Vitals:   08/23/14 1552  BP: 129/90  Pulse: 77  Temp: 98.7 F (37.1 C)   Head and neck examination: Grossly unremarkable.  Nodes: Without palpable cervical, supraclavicular, or axillary lymphadenopathy.  Breasts: There is residual erythema along the right breast with minimal thickening of the breast.  No masses are appreciated.  Left breast without masses or lesions.  Extremities: She wears a right upper extremity sleeve and glove.  There is no significant lymphedema appreciated.  Impression: Satisfactory progress, however she developed mild lymphedema following her radiation therapy.  Hopefully, her lymphedema will be transient since she did not receive nodal irradiation.  She understands that a small percentage of patients who undergo sentinel lymph node biopsy alone can develop lymphedema.  Plan: She will see Dr. Brantley Stage for a follow-up visit in October, and Dr. Jana Hakim next week.  Her last mammography was in February, so she can wait until early next year or later this year for baseline mammography.

## 2014-08-29 ENCOUNTER — Other Ambulatory Visit (HOSPITAL_BASED_OUTPATIENT_CLINIC_OR_DEPARTMENT_OTHER): Payer: No Typology Code available for payment source

## 2014-08-29 ENCOUNTER — Other Ambulatory Visit: Payer: Self-pay | Admitting: *Deleted

## 2014-08-29 ENCOUNTER — Ambulatory Visit (HOSPITAL_BASED_OUTPATIENT_CLINIC_OR_DEPARTMENT_OTHER): Payer: No Typology Code available for payment source | Admitting: Oncology

## 2014-08-29 VITALS — BP 129/86 | HR 79 | Temp 98.6°F | Resp 18 | Ht 66.0 in | Wt 179.4 lb

## 2014-08-29 DIAGNOSIS — Q613 Polycystic kidney, unspecified: Secondary | ICD-10-CM | POA: Diagnosis not present

## 2014-08-29 DIAGNOSIS — C50411 Malignant neoplasm of upper-outer quadrant of right female breast: Secondary | ICD-10-CM | POA: Diagnosis not present

## 2014-08-29 DIAGNOSIS — K7689 Other specified diseases of liver: Secondary | ICD-10-CM

## 2014-08-29 DIAGNOSIS — N281 Cyst of kidney, acquired: Secondary | ICD-10-CM

## 2014-08-29 DIAGNOSIS — Z808 Family history of malignant neoplasm of other organs or systems: Secondary | ICD-10-CM

## 2014-08-29 DIAGNOSIS — Z801 Family history of malignant neoplasm of trachea, bronchus and lung: Secondary | ICD-10-CM

## 2014-08-29 DIAGNOSIS — Z800 Family history of malignant neoplasm of digestive organs: Secondary | ICD-10-CM

## 2014-08-29 DIAGNOSIS — Z803 Family history of malignant neoplasm of breast: Secondary | ICD-10-CM

## 2014-08-29 DIAGNOSIS — Z720 Tobacco use: Secondary | ICD-10-CM | POA: Diagnosis not present

## 2014-08-29 LAB — CBC WITH DIFFERENTIAL/PLATELET
BASO%: 0.6 % (ref 0.0–2.0)
BASOS ABS: 0 10*3/uL (ref 0.0–0.1)
EOS%: 3.2 % (ref 0.0–7.0)
Eosinophils Absolute: 0.2 10*3/uL (ref 0.0–0.5)
HCT: 42.4 % (ref 34.8–46.6)
HGB: 14.5 g/dL (ref 11.6–15.9)
LYMPH%: 24.6 % (ref 14.0–49.7)
MCH: 32.5 pg (ref 25.1–34.0)
MCHC: 34.2 g/dL (ref 31.5–36.0)
MCV: 95.1 fL (ref 79.5–101.0)
MONO#: 0.4 10*3/uL (ref 0.1–0.9)
MONO%: 7.8 % (ref 0.0–14.0)
NEUT#: 3.1 10*3/uL (ref 1.5–6.5)
NEUT%: 63.8 % (ref 38.4–76.8)
Platelets: 292 10*3/uL (ref 145–400)
RBC: 4.45 10*6/uL (ref 3.70–5.45)
RDW: 13.5 % (ref 11.2–14.5)
WBC: 4.9 10*3/uL (ref 3.9–10.3)
lymph#: 1.2 10*3/uL (ref 0.9–3.3)

## 2014-08-29 LAB — COMPREHENSIVE METABOLIC PANEL (CC13)
ALK PHOS: 60 U/L (ref 40–150)
ALT: 15 U/L (ref 0–55)
ANION GAP: 8 meq/L (ref 3–11)
AST: 17 U/L (ref 5–34)
Albumin: 3.7 g/dL (ref 3.5–5.0)
BUN: 10.3 mg/dL (ref 7.0–26.0)
CO2: 27 meq/L (ref 22–29)
CREATININE: 0.7 mg/dL (ref 0.6–1.1)
Calcium: 9.1 mg/dL (ref 8.4–10.4)
Chloride: 107 mEq/L (ref 98–109)
EGFR: >90 mL/min/{1.73_m2} (ref >90)
Glucose: 109 mg/dl (ref 70–140)
Potassium: 3.9 mEq/L (ref 3.5–5.1)
Sodium: 142 mEq/L (ref 136–145)
Total Bilirubin: 0.75 mg/dL (ref 0.20–1.20)
Total Protein: 6.8 g/dL (ref 6.4–8.3)

## 2014-08-29 MED ORDER — TAMOXIFEN CITRATE 20 MG PO TABS: 20.0000 mg | ORAL_TABLET | Freq: Every day | ORAL | Status: AC

## 2014-08-29 NOTE — Progress Notes (Signed)
[place]  [phone] Fax:([phone]     ID: Brooke Ferguson DOB: 07/12/1970  MR#: [id number]  MQK#:[id number]  Patient Care Team: Brien Few, MD as PCP - General (Obstetrics and Gynecology) Erroll Luna, MD as Consulting Physician (General Surgery) Chauncey Cruel, MD as Consulting Physician (Oncology) Arloa Koh, MD as Consulting Physician (Radiation Oncology) Rockwell Germany, RN as Registered Nurse Mauro Kaufmann, RN as Registered Nurse Holley Bouche, NP as Nurse Practitioner (Nurse Practitioner) Brien Few, MD as Consulting Physician (Obstetrics and Gynecology) OTHER MD: Jamal Maes MD  CHIEF COMPLAINT: early-stage estrogen receptor positive breast cancer  CURRENT TREATMENT: tamoxifen   BREAST CANCER HISTORY: From the original intake note:  Brooke Ferguson has received yearly mammography since age 37. She has breast density category C. On 09/30/2013 bilateral screening mammography showed a possible asymmetry in the right breast. Right diagnostic mammography with tomosynthesis and right breast ultrasonography was performed 10/08/2013. On spot compression views there appears to be a persistent mixed attenuation mass in the right breast suggesting an intramammary lymph node. Ultrasound showed several small cysts not related to this finding. There was a diagnosed in her graphic abnormality noted associated with a possible abnormality.   Accordingly 6 month follow-up was suggested and performed 04/12/2014. On this date right diagnostic mammography and ultrasonography showed an oval 5 mm mass with indistinct med margins in the upper right breast. This was not palpable. Targeted ultrasound now found a hypoechoic mass measuring 5 mm in the area in question, and this was biopsied 04/22/2014. The pathology showed (SAA [id number]) and invasive ductal carcinoma, grade 1, estrogen receptor 100% positive, progesterone receptor 97% positive, both with  strong staining intensity, with an MIB-1 of 12%, and no HER-2 amplification, the signals ratio being 1.21 and the number per cell 2.00.  On 05/02/2014 the patient underwent bilateral breast MRI. This found the breast density to be category B. In the upper outer quadrant of the right breast there was a 6 mm enhancing mass with slightly irregular margins. There were no abnormal lymph nodes and the left breast was unremarkable. In the upper abdomen and left upper quadrant there were innumerable cysts, some measuring up to 6 cm in size.  The patient's subsequent history is as detailed below.  INTERVAL HISTORY: Ayriel returns today for follow-up of her early stage breast cancer accompanied by her husband Brooke Ferguson.. Since her last visit here she completed her radiation treatments. She didn't have problems with fatigue. She did have significant erythema and desquamation. That has largely resolved. She is ready to start anti-estrogens.  REVIEW OF SYSTEMS: She is continuing to menstruate regularly, once a month for 3 days, not heavy. She is somewhat anxious both about the diagnosis of breast cancer and about the issue regarding renal cysts, which she forgot to discuss last time. Also since the last visit here she developed mild lymphedema in the right upper extremity. She has undergone physical therapy for this and is wearing a sleeve. A detailed review of systems today was otherwise stable.  PAST MEDICAL HISTORY: Past Medical History  Diagnosis Date  . Bulging disc     BETWEEN C6-7, HAS HAD 2 CORTISONE INJECTIONS  . 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 Breast cancer of upper-outer quadrant of right female breast 04/26/2014  . History of lumpectomy of right breast 05/17/14    With sentinel node biopsy  . S/P radiation therapy 06/21/2014 through 07/13/2014     Right breast  4250 cGy in 17 sessions     PAST SURGICAL  HISTORY: Past Surgical History  Procedure Laterality Date  . Hysteroscopy with resectoscope  10/25/2010    Procedure: HYSTEROSCOPY WITH RESECTOSCOPE;  Surgeon: Lovenia Kim, MD;  Location: [facility];  Service: Gynecology;  Laterality: N/A;  Polypectomy  . Wisdom tooth extraction    . Dilation and curettage of uterus    . Diagnostic laparoscopy      OVARIAN CYCTS AT AGE 44 YRS    FAMILY HISTORY Family History  Problem Relation Age of Onset  . Cancer Mother     astrocytoma; deceased 6  . Breast cancer Maternal Grandmother     Great-Great Grandmotherlung; smoker; currently 2  . Cancer Other     colon; mat grandmother's sister  . Cancer Other     esophagus; mat grandmother's sister   The patient's father is alive, age 14 as of 05-20-2014. The patient's mother died at age 53 from an astrocytoma which was diagnosed a year earlier. The patient has no brothers, one sister. There is no history of breast cancer in the the immediate family. On the maternal side however the patient's mother's grandmother lived to be 50 but had been diagnosed with breast cancer at some point. There are also cases of lung cancer, esophageal cancer, and colon cancer on the mother's side of the family. There are no other breast or ovarian cancers to the patient's knowledge  GYNECOLOGIC HISTORY:  No LMP recorded. Menarche age 4. The patient is GX P0. She still having regular periods. She used oral contraceptives between the ages of 14 and 33. She underwent hysteroscopy with "scraping" for uterine polyps in 2012. She also is status post partial oophorectomy on the right because of an ovarian cyst.--(At the 08/29/2014 visited the patient explained she has found further information regarding her family. There is a cousin who developed breast cancer in her 59s. There is another cousin, but she does not know the age, undergoing dialysis. 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 A stepson, Brooke Ferguson room and, 35, lives with them. He is currently a Ship broker. The patient is not a church attender    ADVANCED DIRECTIVES: not in place   HEALTH MAINTENANCE: History  Substance Use Topics  . Smoking status: Never Smoker   . Smokeless tobacco: Never Used  . Alcohol Use: Yes     Comment: SOCIALLY     Colonoscopy:never  DGL:OVFIEP 2015  Bone density:never  Lipid panel:  Allergies  Allergen Reactions  . Vicodin [Hydrocodone-Acetaminophen] Nausea And Vomiting    Current Outpatient Prescriptions  Medication Sig Dispense Refill  . betamethasone dipropionate (DIPROLENE) 0.05 % cream Apply topically daily.    Marland Kitchen ibuprofen (ADVIL,MOTRIN) 800 MG tablet   0   No current facility-administered medications for this visit.    OBJECTIVE: young White woman who appears well Filed Vitals:   08/29/14 1243  BP: 129/86  Pulse: 79  Temp: 98.6 F (37 C)  Resp: 18     Body mass index is 28.97 kg/(m^2).    ECOG FS:0 - Asymptomatic  Sclerae unicteric, pupils round and equal Oropharynx clear and moist-- no thrush or other lesions No cervical or supraclavicular adenopathy Lungs no rales or rhonchi Heart regular rate and rhythm Abd soft, nontender, positive bowel sounds MSK no  focal spinal tenderness, no upper extremity lymphedema Neuro: nonfocal, well oriented, appropriate affect Breasts: The right breast is status post lumpectomy and radiation. There is minimal erythema remaining. There is no desquamation. There is no palpable mass. The cosmetic result is good. The right axilla is benign. The left breast is unremarkable.  LAB RESULTS:  CMP     Component Value Date/Time   NA 141 05/04/2014 1144   NA 141 03/18/2011 1200   K 4.5 05/04/2014 1144   K 3.7 03/18/2011 1200   CL 107 03/18/2011 1200   CO2 25 05/04/2014 1144   CO2 25  03/18/2011 1200   GLUCOSE 115 05/04/2014 1144   GLUCOSE 91 03/18/2011 1200   BUN 12.5 05/04/2014 1144   BUN 14 03/18/2011 1200   CREATININE 0.8 05/04/2014 1144   CREATININE 0.70 03/18/2011 1200   CALCIUM 9.6 05/04/2014 1144   CALCIUM 9.2 03/18/2011 1200   PROT 7.1 05/04/2014 1144   PROT 6.5 03/18/2011 1200   ALBUMIN 3.9 05/04/2014 1144   ALBUMIN 3.5 03/18/2011 1200   AST 17 05/04/2014 1144   AST 13 03/18/2011 1200   ALT 10 05/04/2014 1144   ALT 11 03/18/2011 1200   ALKPHOS 61 05/04/2014 1144   ALKPHOS 42 03/18/2011 1200   BILITOT 0.80 05/04/2014 1144   BILITOT 0.3 03/18/2011 1200   GFRNONAA >90 03/18/2011 1200   GFRAA >90 03/18/2011 1200    INo results found for: SPEP, UPEP  Lab Results  Component Value Date   WBC 4.9 08/29/2014   NEUTROABS 3.1 08/29/2014   HGB 14.5 08/29/2014   HCT 42.4 08/29/2014   MCV 95.1 08/29/2014   PLT 292 08/29/2014      Chemistry      Component Value Date/Time   NA 141 05/04/2014 1144   NA 141 03/18/2011 1200   K 4.5 05/04/2014 1144   K 3.7 03/18/2011 1200   CL 107 03/18/2011 1200   CO2 25 05/04/2014 1144   CO2 25 03/18/2011 1200   BUN 12.5 05/04/2014 1144   BUN 14 03/18/2011 1200   CREATININE 0.8 05/04/2014 1144   CREATININE 0.70 03/18/2011 1200      Component Value Date/Time   CALCIUM 9.6 05/04/2014 1144   CALCIUM 9.2 03/18/2011 1200   ALKPHOS 61 05/04/2014 1144   ALKPHOS 42 03/18/2011 1200   AST 17 05/04/2014 1144   AST 13 03/18/2011 1200   ALT 10 05/04/2014 1144   ALT 11 03/18/2011 1200   BILITOT 0.80 05/04/2014 1144   BILITOT 0.3 03/18/2011 1200       No results found for: LABCA2  No components found for: LABCA125  No results for input(s): INR in the last 168 hours.  Urinalysis    Component Value Date/Time   COLORURINE YELLOW 03/18/2011 1322   APPEARANCEUR CLOUDY* 03/18/2011 1322   LABSPEC 1.026 03/18/2011 1322   PHURINE 6.0 03/18/2011 1322   GLUCOSEU NEGATIVE 03/18/2011 1322   HGBUR NEGATIVE 03/18/2011  1322   BILIRUBINUR SMALL* 03/18/2011 1322   KETONESUR >80* 03/18/2011 1322   PROTEINUR NEGATIVE 03/18/2011 1322   UROBILINOGEN 0.2 03/18/2011 1322   NITRITE NEGATIVE 03/18/2011 1322   LEUKOCYTESUR NEGATIVE 03/18/2011 1322    STUDIES: CLINICAL DATA: History of breast cancer with light right lumpectomy. Diagnosed 2/16. Breast MRI demonstrating hepatic cysts.  EXAM: MRI ABDOMEN WITHOUT AND WITH CONTRAST  TECHNIQUE: Multiplanar multisequence MR imaging of the abdomen was performed both before and after the administration of intravenous contrast.  CONTRAST: 93m MULTIHANCE GADOBENATE DIMEGLUMINE 529 MG/ML IV  SOLN  COMPARISON: Breast MR of 05/02/2014.  FINDINGS: Lower chest: Normal heart size without pericardial or pleural effusion. Fluid collection in the right breast is likely postoperative. Example 2.7 cm on image 1 of series 3.  Hepatobiliary: Innumerable hepatic cysts. A cluster of cysts in the lateral segment left liver lobe, including a dominant 6.5 x 5.3 cm lesion on image 13 of series 3. Medial segment left liver lobe dominant 5.9 x 7.5 cm lesion.  Marked hepatomegaly, 24.1 cm craniocaudal. Mild hepatic steatosis.  Hyper enhancing focus in the posterior segment right liver lobe measures 1.3 cm, including on image 41 of series 11004. Moderately T2 hyperintense on image 16 of series 8. Most consistent with a hemangioma.  No suspicious liver lesion. Normal gallbladder, without biliary ductal dilatation.  Pancreas: Normal, without mass or ductal dilatation.  Spleen: Normal  Adrenals/Urinary Tract: Normal adrenal glands. Bilateral renal cysts. Precontrast T1 hyperintense lesions are identified within both kidneys. Example series 1000. No post-contrast enhancement in any of these lesions. No hydronephrosis.  Stomach/Bowel: Normal stomach, without wall thickening. Normal abdominal bowel loops.  Vascular/Lymphatic: Normal caliber of the aorta and  branch vessels. No retroperitoneal or retrocrural adenopathy.  Other: No ascites.  Musculoskeletal: No acute osseous abnormality.  IMPRESSION: 1. Innumerable hepatic and renal cysts. 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 Some renal cysts demonstrate complexity. No enhancing renal lesion. 2. Hepatomegaly and hepatic steatosis. Right hepatic hemangioma.   Electronically Signed  By: Abigail Miyamoto M.D.  On: 05/30/2014 08:50  ASSESSMENT: 44 y.o. BRCA negative [place] woman status post right breast upper outer quadrant biopsy 04/22/2014 for a clinical T1b N0, stage IA  Invasive ductal carcinoma, grade 1, strongly estrogen and progesterone receptor positive, HER-2 negative, with an MIB-1 of 12%   (1) genetics testing using the [employer] gene panel/ [employer] showed no deleterious mutations in ATM, BARD1, BRCA1, BRCA2, BRIP1, CDH1, CHEK2, EPCAM, MLH1, MRE11A, MSH2, MSH6, MUTYH, NBN, NF1, PALB2, PMS2, PTEN, RAD50, RAD51C, RAD51D, SMARCA4, STK11, or TP53.   (2) left partial mastectomy with sentinel lymph node sampling 05/17/2014 showed a pT1b pN0, stage IA invasive ductal carcinoma, grade 1, with negative margins.   (3) Oncotype DX score of 9 predicts a 10 year risk of outside the breast recurrence of 6% if the patient's only systemic treatment is tamoxifen for 5 years. It also predicts no benefit from chemotherapy.   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 This will consist of anti-estrogens. Since she is still actively menstruating we are going to start with tamoxifen.  She has a good understanding of the possible toxicities, side effects and complications of this agent, and she  has that information in writing. Since she is still menstruating, many of the symptoms commonly experienced by postmenopausal women may not occur or may occur in a milder form. She understands of course that tamoxifen may interrupt her periods but that it is not a contraceptive.  Quite aside from that, we discussed her multiple renal and liver cysts. She has a relative that she does not know well undergoing dialysis, for unknown reasons per she understands the most common reason for end-stage renal disease is hypertension and diabetes. Nevertheless I think she would benefit from a discussion with  the renal specialists and I am requesting that for further evaluation and advice regarding management of polycystic kidney disease.  She will have mammography late August. She will see me again in September. She knows to call for any problems that may develop before that visit.  Chauncey Cruel, MD   08/29/2014 12:53 PM Medical Oncology and Hematology [facility] [address] [place], [place] [address] Tel. [phone]    Fax. [phone]

## 2014-08-30 ENCOUNTER — Telehealth: Payer: Self-pay | Admitting: Oncology

## 2014-08-30 LAB — FOLLICLE STIMULATING HORMONE: FSH: 5.5 m[IU]/mL

## 2014-08-30 NOTE — Telephone Encounter (Signed)
Patient confirmed appointment for September, patient aware [facility] will call and schedule appointment for referral.

## 2014-09-02 LAB — ESTRADIOL, ULTRA SENS: Estradiol, Ultra Sensitive: 196 pg/mL

## 2014-09-06 ENCOUNTER — Other Ambulatory Visit: Payer: Self-pay | Admitting: Oncology

## 2014-09-16 ENCOUNTER — Telehealth: Payer: Self-pay | Admitting: Adult Health

## 2014-09-16 NOTE — Telephone Encounter (Signed)
I left a voicemail for Ms. Brooke Ferguson asking her to return my call to schedule her Survivorship Clinic appt, now that she has completed her active treatment for breast cancer.  I left my direct office number for her to return my call.  I look forward to participating in her care.   Mike Craze, NP [place] [phone]

## 2014-10-27 ENCOUNTER — Telehealth: Payer: Self-pay | Admitting: Adult Health

## 2014-10-27 NOTE — Telephone Encounter (Signed)
I spoke with Brooke Ferguson to schedule her survivorship visit now that she has completed treatment for breast cancer.  She is interested in coming in for a visit and I have scheduled her to see Chestine Spore, our breast survivorship NP next week.  I gave her instructions on where to check in for this appointment.  We look forward to participating in her care.   Mike Craze, NP [place] [phone]

## 2014-10-28 ENCOUNTER — Ambulatory Visit
Admission: RE | Admit: 2014-10-28 | Discharge: 2014-10-28 | Disposition: A | Discharge status: Home / Self Care | Payer: No Typology Code available for payment source | Source: Ambulatory Visit | Attending: Oncology | Admitting: Oncology

## 2014-10-28 ENCOUNTER — Other Ambulatory Visit: Payer: Self-pay | Admitting: Oncology

## 2014-10-28 DIAGNOSIS — C50411 Malignant neoplasm of upper-outer quadrant of right female breast: Secondary | ICD-10-CM

## 2014-10-28 DIAGNOSIS — N6320 Unspecified lump in the left breast, unspecified quadrant: Secondary | ICD-10-CM

## 2014-11-01 ENCOUNTER — Ambulatory Visit (HOSPITAL_BASED_OUTPATIENT_CLINIC_OR_DEPARTMENT_OTHER): Payer: No Typology Code available for payment source | Admitting: Nurse Practitioner

## 2014-11-01 VITALS — BP 130/82 | HR 92 | Temp 98.9°F | Resp 20 | Ht 66.0 in | Wt 177.4 lb

## 2014-11-01 DIAGNOSIS — N649 Disorder of breast, unspecified: Secondary | ICD-10-CM

## 2014-11-01 DIAGNOSIS — C50411 Malignant neoplasm of upper-outer quadrant of right female breast: Secondary | ICD-10-CM | POA: Diagnosis not present

## 2014-11-02 ENCOUNTER — Encounter: Payer: Self-pay | Admitting: Nurse Practitioner

## 2014-11-02 NOTE — Progress Notes (Signed)
CLINIC:  Cancer Survivorship   REASON FOR VISIT:  Routine follow-up post-treatment for a recent history of breast cancer.  BRIEF ONCOLOGIC HISTORY:    Breast cancer of upper-outer quadrant of right female breast   09/30/2013 Mammogram Right breast: possible asymmetry   10/08/2013 Breast US Right breast: Mass is less apparent with mixed attenuation, suggesting an intramammary lymph node. Suggest repeat imaging in 6 months.   04/12/2014 Breast US Short term follow up U/S of right breast: Oval 5 mm mass with minimally indistinct margins superiorly, in the approximate 12 o'clock location based on review of previous imaging, corresponding to previous area.   04/22/2014 Initial Biopsy Right breast needle core bx: invasive ductal carcinoma, grade 1, involving multiple cores, measuring 5 mm. ER+ (100%), PR+ (97%), HER2/neu negative (ratio 1.21), Ki67 12%. Calcs present, no DCIS.   05/02/2014 Breast MRI Biopsy proven malignancy in the UOQ of the right breast measuring approximately 6 mm in greatest diameter. No additional areas of concern are seen in the right breast. No MRI evidence of malignancy in the left breast.   05/02/2014 Clinical Stage Stage IA: T1 N0   05/05/2014 Procedure Genetic testing: OvaNext panel performed revealing no clinical significant variant in ATM, BARD1, BRCA1, BRCA2, BRIP1, CDH1, CHEK2, EPCAM, MLH1, MRE11A, MSH2, MSH6, MUTYH, NBN, NF1, PALB2, PMS2, PTEN, RAD50, RAD51C, RAD51D, SMARCA4, STK11, and TP53.   05/17/2014 Definitive Surgery Right lumpectomy with SLNB ([facility]): Invasive ductal carcinoma, grade 1, clear margins, 3 sentinel lymph nodes removed and negative for malignancy.   05/17/2014 Pathologic Stage Stage IA: pT1b pN0   05/17/2014 Oncotype testing Score: 9 (6% ROR). No chemotherapy (Magrinat).   06/21/2014 - 07/13/2014 Radiation Therapy Adjuvant RT completed [name]): Right breast: 42.5 Gy over 17 fractions   08/29/2014 -  Anti-estrogen oral therapy Tamoxifen 20 mg daily  (Magrinat).   10/28/2014 Procedure Dx mammo: no evidence of malignancy in right breast, post surgical changes.  Left breast with new cluster of cysts at 2:00, 5 cm from the nipple, with the larger of the cysts measuring 0.7 by 0.3 x 0.5 cm.   10/28/2014 Procedure Ultrasound guided needle biopsy of left cyst: pending    INTERVAL HISTORY:  Brooke Ferguson presents to the [facility] today for our initial meeting to review her survivorship care plan detailing her treatment course for breast cancer, as well as monitoring long-term side effects of that treatment, education regarding health maintenance, screening, and overall wellness and health promotion.     Overall, Brooke Ferguson reports feeling quite well since completing her radiation therapy approximately three months ago. Her skin has improved and she denies fatigue. She has had increased anxiety over the last five days due to her having recently undergone mammography revealing new cystic areas in her left breast.  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  She will follow up with Dr. Brantley Stage in a few weeks for further discussion and planning.  Her right breast showed only post treatment changes.  Brooke Ferguson denies any cough, shortness of breath, or pain.  She has begun her Tamoxifen and has developed hot flashes, which occur only at night and awaken her from sleep.  She feels that these are tolerable at this time.    REVIEW OF SYSTEMS:  General: Denies fever, chills, unintentional weight loss, or generalized fatigue.  Cardiac: Denies palpitations, chest pain, and lower extremity edema.  Respiratory: Denies dyspnea on exertion.  GI: Denies abdominal pain, constipation, diarrhea, nausea, or vomiting.  GU: Denies dysuria, hematuria, vaginal bleeding, vaginal discharge, or vaginal dryness.  Musculoskeletal: Denies joint or bone pain.  Neuro: Denies headache or recent falls. Denies peripheral neuropathy. Skin:  Denies rash, pruritis, or open wounds.  Breast: Denies any new nodularity, masses, tenderness, nipple changes, or nipple discharge.  Psych: Denies depression, anxiety, insomnia, or memory loss.   A 14-point review of systems was completed and was negative, except as noted above.   ONCOLOGY TREATMENT TEAM:  1. Surgeon:  Dr. Brantley Stage at [facility]  2. Medical Oncologist: Dr. Jana Hakim 3. Radiation Oncologist: Dr. [name]    PAST MEDICAL/SURGICAL HISTORY:  Past Medical History  Diagnosis Date  . Bulging disc     BETWEEN C6-7, HAS HAD 2 CORTISONE INJECTIONS  . 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 Breast cancer of upper-outer quadrant of right female breast 04/26/2014  . History of lumpectomy of right breast 05/17/14    With sentinel node biopsy  . S/P radiation therapy 06/21/2014 through 07/13/2014     Right breast 4250 cGy in 17 sessions    Past Surgical History  Procedure Laterality Date  . Hysteroscopy with resectoscope  10/25/2010    Procedure: HYSTEROSCOPY WITH RESECTOSCOPE;  Surgeon: Lovenia Kim, MD;  Location: [facility];  Service: Gynecology;  Laterality: N/A;  Polypectomy  . Wisdom tooth extraction    . Dilation and curettage of uterus    . Diagnostic laparoscopy      OVARIAN CYCTS AT AGE 44 YRS     ALLERGIES:  Allergies  Allergen Reactions  . Vicodin [Hydrocodone-Acetaminophen] Nausea And Vomiting     CURRENT MEDICATIONS:  Current Outpatient Prescriptions on File Prior to Visit  Medication Sig Dispense Refill  . betamethasone dipropionate (DIPROLENE) 0.05 % cream Apply topically daily.    Marland Kitchen ibuprofen (ADVIL,MOTRIN) 800 MG tablet   0   No current facility-administered medications on file prior to visit.     ONCOLOGIC FAMILY HISTORY:  Family History  Problem Relation Age of Onset  . Cancer Mother     astrocytoma; deceased 66  . Breast cancer Maternal Grandmother      Great-Great Grandmotherlung; smoker; currently 46  . Cancer Other     colon; mat grandmother's sister  . Cancer Other     esophagus; mat grandmother's sister     GENETIC COUNSELING/TESTING: Yes, completed 05/05/2014. OvaNext panel revealed no clinical significant variant in ATM, BARD1, BRCA1, BRCA2, BRIP1, CDH1, CHEK2, EPCAM, MLH1, MRE11A, MSH2, MSH6, MUTYH, NBN, NF1, PALB2, PMS2, PTEN, RAD50, RAD51C, RAD51D, SMARCA4, STK11, and TP53.  SOCIAL HISTORY:  Brooke Ferguson is married and lives with her family in [place], [place].  She has 1 stepson who lives with her and her husband. 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  She denies any current or history of tobacco or illicit drug use. She uses alcohol socially.     PHYSICAL EXAMINATION:  Vital Signs:   Filed Vitals:   11/01/14 1508  BP: 130/82  Pulse: 92  Temp: 98.9 F (37.2 C)  Resp: 20   General: Well-nourished, well-appearing female in no acute distress.  She is unaccompanied in clinic today.   HEENT: Head is atraumatic and normocephalic.  Pupils equal and reactive to light and accomodation. Conjunctivae clear without exudate.  Sclerae anicteric. Oral mucosa is pink, moist, and intact without lesions.  Oropharynx is pink without lesions or erythema.  Lymph: No cervical, supraclavicular, infraclavicular, or axillary lymphadenopathy noted on palpation.  Cardiovascular: Regular rate and rhythm without murmurs, rubs, or gallops. Respiratory: Clear to auscultation bilaterally. Chest expansion symmetric without accessory muscle use on inspiration or expiration.  Chest: Biopsy site at left breast with expected bruising.  No swelling or redness.  Scar at right breast from lumpectomy without nodularity or tenderness. GI: Abdomen soft and round. No tenderness to palpation. Bowel sounds normoactive in 4 quadrants. No hepatosplenomegaly.   GU: Deferred.  Musculoskeletal: Muscle strength 5/5 in all extremities.   Full ROM noted in all extremities.  Neuro: No focal deficits. Steady gait.  Psych: Mood and affect normal and appropriate for situation.  Extremities: No edema, cyanosis, or clubbing.  Skin: Warm and dry. No open lesions noted.   LABORATORY DATA:  Surgical path report from 10/28/2014 [phone]): Breast, left, needle core biopsy, 2 o'clock reveals complex sclerosing lesion.    DIAGNOSTIC IMAGING:  Diagnostic mammography (bilateral) performed 10/27/2014: No suspicious masses or calcifications are seen in the right breast. Postsurgical changes are present in the upper far outer right breast related to interval lumpectomy Targeted ultrasound of the left breast was performed demonstrating a cluster of cysts at 2 o'clock 5 cm from the nipple, with a larger of the cysts measuring 0.7 by 0.3 x 0.5 cm. This accounts for the mass seen at mammography.       ASSESSMENT AND PLAN:   1. History of breast cancer: Stage IA invasive ductal carcinoma, S/P lumpectomy and radiation therapy, now on Tamoxifen as adjuvant endocrine therapy, with path findings as outlined above.  No clinical symptoms worrisome for disease recurrence.  Brooke Ferguson will follow-up with her medical oncologist,  Dr. Jana Hakim in September 2016  with history and physical exam per surveillance protocol.  She will continue her anti-estrogen therapy with Tamoxifen and will report the worsening of her hot flashes. She was instructed to make Dr. Jana Hakim or myself aware if she begins to experience any other side effects of the medication and I could see her back in clinic to help manage those side effects, as needed. Though the incidence is low, there is an associated risk of endometrial cancer with anti-estrogen therapies like Tamoxifen.  Brooke Ferguson was encouraged to contact Dr. Jana Hakim or myself with any vaginal bleeding while taking Tamoxifen. Of note, she is still menstruating, although her menstrual periods are more irregular.  She has been  advised that her menses may cease and she will still need to use birth control measures. Other side effects of Tamoxifen were again reviewed with her as well. As above, she will follow up with Dr. Brantley Stage in September to discuss the recent findings of the complex sclerosing lesion in the left breast.  She understands that this is not a malignant condition, but will still need discussion with Dr. Brantley Stage regarding treatment options.  A comprehensive survivorship care plan and treatment summary was reviewed with the patient today detailing her breast cancer diagnosis, treatment course, potential late/long-term effects of treatment, appropriate follow-up care with recommendations for the future, and patient education resources.  A copy of this summary, along with a letter will be sent to the patient's primary care provider via in basket message after today's visit.  Brooke Ferguson is welcome to return to the Survivorship Clinic in the future, as needed; no follow-up will be scheduled at this time.    2. Cancer screening:  Due to Ms. Delpozo's history and her age, she should receive screening for skin cancers, colon cancer (beginning at age 89), and gynecologic cancers.  The  information and recommendations are listed on the patient's comprehensive care plan/treatment summary and were reviewed in detail with the patient.    3. Health maintenance and wellness promotion: Brooke Ferguson was encouraged to consume 5-7 servings of fruits and vegetables per day. We reviewed the "Nutrition Rainbow" handout, as well as the handout about "Nutrition for Breast Cancer Survivors."  She was also encouraged to engage in moderate to vigorous exercise for 30 minutes per day most days of the week. We discussed the [employer] fitness program, which is designed for cancer survivors to help them become more physically fit after cancer treatments.  She was instructed to limit her alcohol consumption and continue to abstain from tobacco  use.   4. Support services/counseling: It is not uncommon for this period of the patient's cancer care trajectory to be one of many emotions and stressors.  We discussed an opportunity for her to participate in the next session of [facility] ("Finding Your New Normal") support group series designed for patients after they have completed treatment.   Brooke Ferguson was encouraged to take advantage of our many other support services programs, support groups, and/or counseling in coping with her new life as a cancer survivor after completing anti-cancer treatment.  She was offered support today through active listening and expressive supportive counseling.  She was given information regarding our available services and encouraged to contact me with any questions or for help enrolling in any of our support group/programs.    A total of 50 minutes of face-to-face time was spent with this patient with greater than 50% of that time in counseling and care-coordination.   Sylvan Cheese, NP  Survivorship Program [facility] [phone]   Note: PRIMARY CARE PROVIDER Lovenia Kim, MD [phone]

## 2014-11-20 NOTE — Progress Notes (Signed)
[place]  Telephone:([phone]) [phone] Fax:(336) [phone]     ID: Brooke Ferguson DOB: 1970-03-22  MR#: [id number]  ULA#:[id number]  Patient Care Team: Brien Few, MD as PCP - General (Obstetrics and Gynecology) Erroll Luna, MD as Consulting Physician (General Surgery) Chauncey Cruel, MD as Consulting Physician (Oncology) Arloa Koh, MD as Consulting Physician (Radiation Oncology) Rockwell Germany, RN as Registered Nurse Mauro Kaufmann, RN as Registered Nurse Holley Bouche, NP as Nurse Practitioner (Nurse Practitioner) Brien Few, MD as Consulting Physician (Obstetrics and Gynecology) Sylvan Cheese, NP as Nurse Practitioner (Nurse Practitioner) OTHER MD: Jamal Maes MD  CHIEF COMPLAINT: early-stage estrogen receptor positive breast cancer  CURRENT TREATMENT: tamoxifen   BREAST CANCER HISTORY: From the original intake note:  Brooke Ferguson has received yearly mammography since age 16. She has breast density category C. On 09/30/2013 bilateral screening mammography showed a possible asymmetry in the right breast. Right diagnostic mammography with tomosynthesis and right breast ultrasonography was performed 10/08/2013. On spot compression views there appears to be a persistent mixed attenuation mass in the right breast suggesting an intramammary lymph node. Ultrasound showed several small cysts not related to this finding. There was a diagnosed in her graphic abnormality noted associated with a possible abnormality.   Accordingly 6 month follow-up was suggested and performed 04/12/2014. On this date right diagnostic mammography and ultrasonography showed an oval 5 mm mass with indistinct med margins in the upper right breast. This was not palpable. Targeted ultrasound now found a hypoechoic mass measuring 5 mm in the area in question, and this was biopsied 04/22/2014. The pathology showed (SAA [id number]) and invasive ductal carcinoma, grade 1, estrogen  receptor 100% positive, progesterone receptor 97% positive, both with strong staining intensity, with an MIB-1 of 12%, and no HER-2 amplification, the signals ratio being 1.21 and the number per cell 2.00.  On 05/02/2014 the patient underwent bilateral breast MRI. This found the breast density to be category B. In the upper outer quadrant of the right breast there was a 6 mm enhancing mass with slightly irregular margins. There were no abnormal lymph nodes and the left breast was unremarkable. In the upper abdomen and left upper quadrant there were innumerable cysts, some measuring up to 6 cm in size.  The patient's subsequent history is as detailed below.  INTERVAL HISTORY: Brooke Ferguson returns today for follow-up of her early stage breast cancer accompanied by her husband Brooke Ferguson.. She has been on tamoxifen since late June 2016. She is generally tolerating that well. She has noticed some more night sweats, but no vaginal wetness, no significant increase in hot flashes, and she has continued to have periods although they are a little bit more irregular now.  On 10/28/2014 she had bilateral diagnostic mammography with tomosynthesis with left breast ultrasonography. The breast density was category C. The right breast showed postoperative changes. In the left breast however there was a lobulated mass with irregular margins in the upper outer quadrant, measuring 2.4 cm. This was not palpable by exam. Ultrasonography showed a cluster of cysts at the 2:00 position 5 cm from the nipple, the larger measuring 0.7 cm.. Biopsy of this area in the left breast was performed [id number]) a complex sclerosing lesion with focal papillary features. She is a ready scheduled to meet with the surgeon, Dr. Brantley Stage, September 23 to discuss surgery  REVIEW OF SYSTEMS: She just started [facility] and finds that very helpful. She has "her moments" and she feels the other people in that group  understand her. Otherwise she says her mood is good  that she is doing fine and a detailed review of systems is noncontributory except as noted  PAST MEDICAL HISTORY: Past Medical History  Diagnosis Date  . Bulging disc     BETWEEN C6-7, HAS HAD 2 CORTISONE INJECTIONS  . 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 Breast cancer of upper-outer quadrant of right female breast 04/26/2014  . History of lumpectomy of right breast 05/17/14    With sentinel node biopsy  . S/P radiation therapy 06/21/2014 through 07/13/2014     Right breast 4250 cGy in 17 sessions     PAST SURGICAL HISTORY: Past Surgical History  Procedure Laterality Date  . Hysteroscopy with resectoscope  10/25/2010    Procedure: HYSTEROSCOPY WITH RESECTOSCOPE;  Surgeon: Lovenia Kim, MD;  Location: [facility];  Service: Gynecology;  Laterality: N/A;  Polypectomy  . Wisdom tooth extraction    . Dilation and curettage of uterus    . Diagnostic laparoscopy      OVARIAN CYCTS AT AGE 14 YRS    FAMILY HISTORY Family History  Problem Relation Age of Onset  . Cancer Mother     astrocytoma; deceased 59  . Breast cancer Maternal Grandmother     Great-Great Grandmotherlung; smoker; currently 59  . Cancer Other     colon; mat grandmother's sister  . Cancer Other     esophagus; mat grandmother's sister   The patient's father is alive, age 27 as of 07-Jun-2014. The patient's mother died at age 60 from an astrocytoma which was diagnosed a year earlier. The patient has no brothers, one sister. There is no history of breast cancer in the the immediate family. On the maternal side however the patient's mother's grandmother lived to be 4 but had been diagnosed with breast cancer at some point. There are also cases of lung cancer, esophageal cancer, and colon cancer on the mother's side of the family. There are no other breast or ovarian cancers to the patient's knowledge  GYNECOLOGIC HISTORY:  No LMP recorded  (within weeks). Menarche age 18. The patient is GX P0. She still having regular periods. She used oral contraceptives between the ages of 29 and 34. She underwent hysteroscopy with "scraping" for uterine polyps in 2012. She also is status post partial oophorectomy on the right because of an ovarian cyst.--(At the 08/29/2014 visited the patient explained she has found further information regarding her family. There is a cousin who developed breast cancer in her 90s. There is another cousin, but she does not know the age, undergoing dialysis. She does not know the reason for end-stage renal disease on that person  SOCIAL HISTORY:  She'll works as a Adult nurse. Brooke Ferguson works for a [employer] account pl[employer] (they do a lot of the things testing for defibrillators.). A stepson, Brooke Ferguson room and, 79, lives with them. He is currently a Ship broker. The patient is not a church attender    ADVANCED DIRECTIVES: not in place   HEALTH MAINTENANCE: Social History  Substance Use Topics  . Smoking status: Never Smoker   . Smokeless tobacco: Never Used  . Alcohol Use: Yes     Comment: SOCIALLY     Colonoscopy:never  BWG:YKZLDJ 2015  Bone density:never  Lipid panel:  Allergies  Allergen Reactions  . Vicodin [Hydrocodone-Acetaminophen] Nausea And Vomiting    Current Outpatient Prescriptions  Medication Sig Dispense Refill  . betamethasone dipropionate (DIPROLENE) 0.05 % cream Apply topically daily.    Marland Kitchen  ibuprofen (ADVIL,MOTRIN) 800 MG tablet   0  . tamoxifen (NOLVADEX) 10 MG tablet Take 20 mg by mouth daily.     No current facility-administered medications for this visit.    OBJECTIVE: young White woman in no acute distress Filed Vitals:   11/21/14 0906  BP: 138/92  Pulse: 83  Temp: 98.5 F (36.9 C)  Resp: 18     Body mass index is 28.68 kg/(m^2).    ECOG FS:0 - Asymptomatic  Sclerae unicteric, pupils round and equal Oropharynx clear and moist-- no thrush or other lesions No cervical or  supraclavicular adenopathy Lungs no rales or rhonchi Heart regular rate and rhythm Abd soft, nontender, positive bowel sounds MSK no focal spinal tenderness, no upper extremity lymphedema Neuro: nonfocal, well oriented, appropriate affect Breasts: The right breast is status post lumpectomy and radiation. The cosmetic result is excellent. There is no evidence of local recurrence. The right axilla is benign. I do not palpate any mass in the left breast.   LAB RESULTS:  CMP     Component Value Date/Time   NA 142 08/29/2014 1231   NA 141 03/18/2011 1200   K 3.9 08/29/2014 1231   K 3.7 03/18/2011 1200   CL 107 03/18/2011 1200   CO2 27 08/29/2014 1231   CO2 25 03/18/2011 1200   GLUCOSE 109 08/29/2014 1231   GLUCOSE 91 03/18/2011 1200   BUN 10.3 08/29/2014 1231   BUN 14 03/18/2011 1200   CREATININE 0.7 08/29/2014 1231   CREATININE 0.70 03/18/2011 1200   CALCIUM 9.1 08/29/2014 1231   CALCIUM 9.2 03/18/2011 1200   PROT 6.8 08/29/2014 1231   PROT 6.5 03/18/2011 1200   ALBUMIN 3.7 08/29/2014 1231   ALBUMIN 3.5 03/18/2011 1200   AST 17 08/29/2014 1231   AST 13 03/18/2011 1200   ALT 15 08/29/2014 1231   ALT 11 03/18/2011 1200   ALKPHOS 60 08/29/2014 1231   ALKPHOS 42 03/18/2011 1200   BILITOT 0.75 08/29/2014 1231   BILITOT 0.3 03/18/2011 1200   GFRNONAA >90 03/18/2011 1200   GFRAA >90 03/18/2011 1200    INo results found for: SPEP, UPEP  Lab Results  Component Value Date   WBC 4.9 08/29/2014   NEUTROABS 3.1 08/29/2014   HGB 14.5 08/29/2014   HCT 42.4 08/29/2014   MCV 95.1 08/29/2014   PLT 292 08/29/2014      Chemistry      Component Value Date/Time   NA 142 08/29/2014 1231   NA 141 03/18/2011 1200   K 3.9 08/29/2014 1231   K 3.7 03/18/2011 1200   CL 107 03/18/2011 1200   CO2 27 08/29/2014 1231   CO2 25 03/18/2011 1200   BUN 10.3 08/29/2014 1231   BUN 14 03/18/2011 1200   CREATININE 0.7 08/29/2014 1231   CREATININE 0.70 03/18/2011 1200      Component Value  Date/Time   CALCIUM 9.1 08/29/2014 1231   CALCIUM 9.2 03/18/2011 1200   ALKPHOS 60 08/29/2014 1231   ALKPHOS 42 03/18/2011 1200   AST 17 08/29/2014 1231   AST 13 03/18/2011 1200   ALT 15 08/29/2014 1231   ALT 11 03/18/2011 1200   BILITOT 0.75 08/29/2014 1231   BILITOT 0.3 03/18/2011 1200       No results found for: LABCA2  No components found for: LABCA125  No results for input(s): INR in the last 168 hours.  Urinalysis    Component Value Date/Time   COLORURINE YELLOW 03/18/2011 1322   APPEARANCEUR CLOUDY* 03/18/2011  1322   LABSPEC 1.026 03/18/2011 1322   PHURINE 6.0 03/18/2011 1322   GLUCOSEU NEGATIVE 03/18/2011 1322   HGBUR NEGATIVE 03/18/2011 1322   BILIRUBINUR SMALL* 03/18/2011 1322   KETONESUR >80* 03/18/2011 1322   PROTEINUR NEGATIVE 03/18/2011 1322   UROBILINOGEN 0.2 03/18/2011 1322   NITRITE NEGATIVE 03/18/2011 1322   LEUKOCYTESUR NEGATIVE 03/18/2011 1322    STUDIES: CLINICAL DATA: Post biopsy of a cluster of a mixed cystic and solid mass/cluster of cysts in the upper-outer left breast.  EXAM: DIAGNOSTIC LEFT MAMMOGRAM POST ULTRASOUND BIOPSY  COMPARISON: Previous exam(s).  FINDINGS: Mammographic images were obtained following ultrasound guided biopsy of the cystic and solid mass in the upper-outer left breast. A ribbon shaped biopsy marking clip is present in the targeted region of the biopsied mass.  IMPRESSION: Appropriate clip position post biopsy of a cystic and solid mass in the upper-outer left breast.  Final Assessment: Post Procedure Mammograms for Marker Placement   Electronically Signed  By: Everlean Alstrom M.D.  On: 10/28/2014 13:06  ASSESSMENT: 44 y.o. BRCA negative [place] woman status post right breast upper outer quadrant biopsy 04/22/2014 for a clinical T1b N0, stage IA  Invasive ductal carcinoma, grade 1, strongly estrogen and progesterone receptor positive, HER-2 negative, with an MIB-1 of 12%   (1) genetics  testing using the [employer]va[employer] gene panel/ [employer] showed no deleterious mutations in ATM, BARD1, BRCA1, BRCA2, BRIP1, CDH1, CHEK2, EPCAM, MLH1, MRE11A, MSH2, MSH6, MUTYH, NBN, NF1, PALB2, PMS2, PTEN, RAD50, RAD51C, RAD51D, SMARCA4, STK11, or TP53.   (2) right partial mastectomy with sentinel lymph node sampling 05/17/2014 showed a pT1b pN0, stage IA invasive ductal carcinoma, grade 1, with negative margins.   (3) Oncotype DX score of 9 predicts a 10 year risk of outside the breast recurrence of 6% if the patient's only systemic treatment is tamoxifen for 5 years. It also predicts no benefit from chemotherapy.   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 Since she is continuing to menstruate, that means she is shedding the endometrial lining every once in a while so it greatly diminishes our concern regarding endometrial polyps, etc.  If the night sweats become more of a problem we can try Neurontin. At this point she does not feel she needs any intervention regarding that.  She is exercising chiefly by walking her dog. I have encouraged her to be more proactive and have as a goal 45 minutes of some activity 5 times a week. We did discuss the complex sclerosing lesion in the left breast. The standard of care for the cyst removal and she already has an appointment with the surgeon later this week to discuss that.  She is participating in [facility] and already benefiting from that.  Accordingly I'm making no changes in her treatment. She will return to see [place] in 3 months. She knows to call for any problems that may develop before that visit.  Chauncey Cruel, MD   11/21/2014 9:19 AM Medical Oncology  and Hematology [facility] [address] [place], [place] [address] Tel. [phone]    Fax. [phone]

## 2014-11-21 ENCOUNTER — Ambulatory Visit (HOSPITAL_BASED_OUTPATIENT_CLINIC_OR_DEPARTMENT_OTHER): Payer: No Typology Code available for payment source | Admitting: Oncology

## 2014-11-21 ENCOUNTER — Telehealth: Payer: Self-pay | Admitting: Oncology

## 2014-11-21 VITALS — BP 138/92 | HR 83 | Temp 98.5°F | Resp 18 | Ht 66.0 in | Wt 177.6 lb

## 2014-11-21 DIAGNOSIS — C50411 Malignant neoplasm of upper-outer quadrant of right female breast: Secondary | ICD-10-CM

## 2014-11-21 NOTE — Telephone Encounter (Signed)
Appointments made and avs printed for patient °

## 2014-11-25 ENCOUNTER — Ambulatory Visit: Payer: Self-pay | Admitting: Surgery

## 2014-11-25 DIAGNOSIS — N6320 Unspecified lump in the left breast, unspecified quadrant: Secondary | ICD-10-CM

## 2014-11-25 NOTE — H&P (Signed)
Brooke Ferguson 11/25/2014 10:02 AM Location: [facility] Patient #: [id number] DOB: Nov 14, 1970 Married / Language: Brooke Ferguson / Race: White Female History of Present Illness [name] A. Cornett MD; 11/25/2014 10:27 AM) Patient words: breast f/u   : 44 y.o. BRCA negative [place] woman status post right breast upper outer quadrant biopsy 04/22/2014 for a clinical T1b N0, stage IA Invasive ductal carcinoma, grade 1, strongly estrogen and progesterone receptor positive, HER-2 negative, with an MIB-1 of 12%   left breast UOQ biopsy 10/28/2014 shows complex sclerosing lesion   Pt has developed a new mammographic abnormality on her left breast that has been core biopsied as sclerosing lesion. She is doing well on tamoxifen except for night sweats.           Breast, left, needle core biopsy, 2 o'clock - COMPLEX SCLEROSING LESION, SEE COMMENT   CLINICAL DATA: Post biopsy of a cluster of a mixed cystic and solid mass/cluster of cysts in the upper-outer left breast. EXAM: DIAGNOSTIC LEFT MAMMOGRAM POST ULTRASOUND BIOPSY COMPARISON: Previous exam(s). FINDINGS: Mammographic images were obtained following ultrasound guided biopsy of the cystic and solid mass in the upper-outer left breast. A ribbon shaped biopsy marking clip is present in the targeted region of the biopsied mass. IMPRESSION: Appropriate clip position post biopsy of a cystic and solid mass in the upper-outer left breast. Final Assessment: Post Procedure Mammograms for Marker Placement Electronically Signed By: Everlean Alstrom M.D. On: 10/28/2014 13:06      CLINICAL DATA: 44 year old female with history of right breast cancer post lumpectomy 05/17/2014 followed by radiation therapy.  EXAM: DIGITAL DIAGNOSTIC BILATERAL MAMMOGRAM WITH 3D TOMOSYNTHESIS WITH CAD  ULTRASOUND LEFT BREAST  COMPARISON: Previous exam(s).  ACR Breast Density Category c: The breast tissue is  heterogeneously dense, which may obscure small masses.  FINDINGS: No suspicious masses or calcifications are seen in the right breast. Postsurgical changes are present in the upper far outer right breast related to interval lumpectomy. Spot compression magnification tangential view of the lumpectomy site as well as spot compression XCCL tomosynthesis of the lumpectomy site was performed. There is no mammographic evidence of locally recurrent malignancy. There is a lobulated mass with irregular margins in the upper-outer left breast measuring approximately 2.4 cm.  Mammographic images were processed with CAD.  Physical examination of the upper-outer left breast is not reveal any palpable masses.  Targeted ultrasound of the left breast was performed demonstrating a cluster of cysts at 2 o'clock 5 cm from the nipple, with a larger of the cysts measuring 0.7 by 0.3 x 0.5 cm. This accounts for the mass seen at mammography.  IMPRESSION: 1. Postsurgical changes in the right breast. No mammographic evidence of malignancy in the right breast.  2. Cluster of cysts in the upper-outer left breast.  RECOMMENDATION: Ultrasound-guided biopsy of the cluster of cysts in the upper-outer left breast is recommended. This will subsequently be performed and dictated separately.  I have discussed the findings and recommendations with the patient. Results were also provided in writing at the conclusion of the visit. If applicable, a reminder letter will be sent to the patient regarding the next appointment.  BI-RADS CATEGORY Category 4A: Low suspicion for malignancy.   Electronically Signed By: Everlean Alstrom M.D.  The patient is a 44 year old female   Allergies (Ammie Eversole, LPN; 4/85/4627 03:50 AM) Vicodin *ANALGESICS - OPIOID*  Medication History (Ammie Eversole, LPN; 0/93/8182 99:37 AM) Advil (200MG Capsule, Oral prn) Active. Tamoxifen Citrate (20MG Tablet, Oral)  Active.  Medications Reconciled    Vitals (Ammie Eversole LPN; 6/46/8032 12:24 AM) 11/25/2014 10:02 AM Weight: 176 lb Height: 66in Body Surface Area: 1.93 m Body Mass Index: 28.41 kg/m Temp.: 98.58F(Oral)  Pulse: 72 (Regular)  BP: 142/80 (Sitting, Left Arm, Standard)     Physical Exam (Thomas A. Cornett MD; 11/25/2014 10:28 AM)  General Mental Status-Alert. General Appearance-Consistent with stated age. Hydration-Well hydrated. Voice-Normal.  Head and Neck Head-normocephalic, atraumatic with no lesions or palpable masses. Trachea-midline. Thyroid Gland Characteristics - normal size and consistency.  Chest and Lung Exam Chest and lung exam reveals -quiet, even and easy respiratory effort with no use of accessory muscles and on auscultation, normal breath sounds, no adventitious sounds and normal vocal resonance. Inspection Chest Wall - Normal. Back - normal.  Breast Note: no masses bilaterally left breast biopsy site noted right breast scar noted no masses post radiation changes   Lymphatic Head & Neck  General Head & Neck Lymphatics: Bilateral - Description - Normal. Axillary  General Axillary Region: Bilateral - Description - Normal. Tenderness - Non Tender.    Assessment & Plan (Thomas A. Cornett MD; 11/25/2014 10:23 AM)  LEFT BREAST MASS (N63) Impression: complex sclerosing lesion recommend left breast seed localized lumpectomy for small risk of malignancy Risk of lumpectomy include bleeding, infection, seroma, more surgery, use of seed/wire, wound care, cosmetic deformity and the need for other treatments, death , blood clots, death. Pt agrees to proceed.  Current Plans Pt Education - [facility] Breast Biopsy HCI: discussed with patient and provided information.   The anatomy and the physiology was discussed. The pathophysiology and natural history of the disease was discussed. Options were discussed and recommendations were made.  Technique, risks, benefits, & alternatives were discussed. Risks such as stroke, heart attack, bleeding, indection, death, and other risks discussed. Questions answered. The patient agrees to proceed. HISTORY OF RIGHT BREAST CANCER (Z85.3) Impression: stable on tamoxifen

## 2014-11-28 ENCOUNTER — Telehealth: Payer: Self-pay | Admitting: *Deleted

## 2014-11-28 NOTE — Telephone Encounter (Signed)
Spoke to pt for 3 mon f/u. Relate doing well and without complaints. Encourage pt to call with needs or questions. Received verbal understanding.

## 2014-12-01 ENCOUNTER — Other Ambulatory Visit: Payer: Self-pay | Admitting: Surgery

## 2014-12-01 DIAGNOSIS — N6320 Unspecified lump in the left breast, unspecified quadrant: Secondary | ICD-10-CM

## 2014-12-29 ENCOUNTER — Encounter (HOSPITAL_BASED_OUTPATIENT_CLINIC_OR_DEPARTMENT_OTHER): Payer: Self-pay | Admitting: *Deleted

## 2015-01-02 ENCOUNTER — Ambulatory Visit
Admission: RE | Admit: 2015-01-02 | Discharge: 2015-01-02 | Disposition: A | Discharge status: Home / Self Care | Payer: No Typology Code available for payment source | Source: Ambulatory Visit | Attending: Surgery | Admitting: Surgery

## 2015-01-02 DIAGNOSIS — N6320 Unspecified lump in the left breast, unspecified quadrant: Secondary | ICD-10-CM

## 2015-01-02 HISTORY — PX: BREAST LUMPECTOMY: SHX2

## 2015-01-03 HISTORY — PX: BREAST EXCISIONAL BIOPSY: SUR124

## 2015-01-05 ENCOUNTER — Ambulatory Visit
Admission: RE | Admit: 2015-01-05 | Discharge: 2015-01-05 | Disposition: A | Discharge status: Home / Self Care | Payer: No Typology Code available for payment source | Source: Ambulatory Visit | Attending: Surgery | Admitting: Surgery

## 2015-01-05 ENCOUNTER — Encounter (HOSPITAL_BASED_OUTPATIENT_CLINIC_OR_DEPARTMENT_OTHER)
Admission: RE | Disposition: A | Discharge status: Home / Self Care | Payer: Self-pay | Source: Ambulatory Visit | Attending: Surgery

## 2015-01-05 ENCOUNTER — Ambulatory Visit (HOSPITAL_BASED_OUTPATIENT_CLINIC_OR_DEPARTMENT_OTHER)
Admission: RE | Admit: 2015-01-05 | Discharge: 2015-01-05 | Disposition: A | Discharge status: Home / Self Care | Payer: No Typology Code available for payment source | Source: Ambulatory Visit | Attending: Surgery | Admitting: Surgery

## 2015-01-05 ENCOUNTER — Ambulatory Visit (HOSPITAL_BASED_OUTPATIENT_CLINIC_OR_DEPARTMENT_OTHER): Payer: No Typology Code available for payment source | Admitting: Anesthesiology

## 2015-01-05 ENCOUNTER — Encounter (HOSPITAL_BASED_OUTPATIENT_CLINIC_OR_DEPARTMENT_OTHER): Payer: Self-pay | Admitting: *Deleted

## 2015-01-05 DIAGNOSIS — Z853 Personal history of malignant neoplasm of breast: Secondary | ICD-10-CM | POA: Diagnosis not present

## 2015-01-05 DIAGNOSIS — N6320 Unspecified lump in the left breast, unspecified quadrant: Secondary | ICD-10-CM

## 2015-01-05 DIAGNOSIS — Z923 Personal history of irradiation: Secondary | ICD-10-CM | POA: Diagnosis not present

## 2015-01-05 DIAGNOSIS — N6022 Fibroadenosis of left breast: Secondary | ICD-10-CM | POA: Insufficient documentation

## 2015-01-05 HISTORY — PX: BREAST LUMPECTOMY WITH RADIOACTIVE SEED LOCALIZATION: SHX6424

## 2015-01-05 SURGERY — BREAST LUMPECTOMY WITH RADIOACTIVE SEED LOCALIZATION
Anesthesia: General | Site: Breast | Laterality: Left

## 2015-01-05 MED ORDER — DEXAMETHASONE SODIUM PHOSPHATE 4 MG/ML IJ SOLN
INTRAMUSCULAR | Status: DC | PRN
  Administered 2015-01-05: 10 mg via INTRAVENOUS

## 2015-01-05 MED ORDER — SODIUM CHLORIDE 0.9 % IJ SOLN
INTRAMUSCULAR | Status: AC
  Filled 2015-01-05: qty 10

## 2015-01-05 MED ORDER — GLYCOPYRROLATE 0.2 MG/ML IJ SOLN: 0.2000 mg | Freq: Once | INTRAMUSCULAR | Status: DC | PRN

## 2015-01-05 MED ORDER — OXYCODONE HCL 5 MG/5ML PO SOLN: 5.0000 mg | Freq: Once | ORAL | Status: DC | PRN

## 2015-01-05 MED ORDER — PROPOFOL 10 MG/ML IV BOLUS
INTRAVENOUS | Status: DC | PRN
  Administered 2015-01-05: 200 mg via INTRAVENOUS

## 2015-01-05 MED ORDER — LIDOCAINE HCL (CARDIAC) 20 MG/ML IV SOLN
INTRAVENOUS | Status: AC
  Filled 2015-01-05: qty 5

## 2015-01-05 MED ORDER — SCOPOLAMINE 1 MG/3DAYS TD PT72: 1.0000 | MEDICATED_PATCH | Freq: Once | TRANSDERMAL | Status: DC | PRN

## 2015-01-05 MED ORDER — BUPIVACAINE-EPINEPHRINE (PF) 0.25% -1:200000 IJ SOLN
INTRAMUSCULAR | Status: DC | PRN
  Administered 2015-01-05: 10 mL

## 2015-01-05 MED ORDER — MEPERIDINE HCL 25 MG/ML IJ SOLN: 6.2500 mg | INTRAMUSCULAR | Status: DC | PRN

## 2015-01-05 MED ORDER — METHYLENE BLUE 1 % INJ SOLN
INTRAMUSCULAR | Status: AC
  Filled 2015-01-05: qty 10

## 2015-01-05 MED ORDER — DEXTROSE 5 % IV SOLN
3.0000 g | INTRAVENOUS | Status: AC
  Administered 2015-01-05: 2 g via INTRAVENOUS

## 2015-01-05 MED ORDER — CEFAZOLIN SODIUM-DEXTROSE 2-3 GM-% IV SOLR
INTRAVENOUS | Status: AC
  Filled 2015-01-05: qty 50

## 2015-01-05 MED ORDER — HYDROMORPHONE HCL 1 MG/ML IJ SOLN
0.2500 mg | INTRAMUSCULAR | Status: DC | PRN
  Administered 2015-01-05: 0.5 mg via INTRAVENOUS
  Administered 2015-01-05: 0.25 mg via INTRAVENOUS

## 2015-01-05 MED ORDER — 0.9 % SODIUM CHLORIDE (POUR BTL) OPTIME
TOPICAL | Status: DC | PRN
  Administered 2015-01-05: 200 mL

## 2015-01-05 MED ORDER — DEXAMETHASONE SODIUM PHOSPHATE 10 MG/ML IJ SOLN
INTRAMUSCULAR | Status: AC
  Filled 2015-01-05: qty 1

## 2015-01-05 MED ORDER — FENTANYL CITRATE (PF) 100 MCG/2ML IJ SOLN
50.0000 ug | INTRAMUSCULAR | Status: DC | PRN
  Administered 2015-01-05: 100 ug via INTRAVENOUS
  Administered 2015-01-05: 50 ug via INTRAVENOUS

## 2015-01-05 MED ORDER — FENTANYL CITRATE (PF) 100 MCG/2ML IJ SOLN
INTRAMUSCULAR | Status: AC
  Filled 2015-01-05: qty 4

## 2015-01-05 MED ORDER — ONDANSETRON HCL 4 MG/2ML IJ SOLN
INTRAMUSCULAR | Status: DC | PRN
  Administered 2015-01-05: 4 mg via INTRAVENOUS

## 2015-01-05 MED ORDER — LIDOCAINE HCL (CARDIAC) 20 MG/ML IV SOLN
INTRAVENOUS | Status: DC | PRN
  Administered 2015-01-05: 50 mg via INTRAVENOUS

## 2015-01-05 MED ORDER — MIDAZOLAM HCL 2 MG/2ML IJ SOLN
1.0000 mg | INTRAMUSCULAR | Status: DC | PRN
  Administered 2015-01-05: 2 mg via INTRAVENOUS

## 2015-01-05 MED ORDER — OXYCODONE HCL 5 MG PO TABS: 5.0000 mg | ORAL_TABLET | Freq: Once | ORAL | Status: DC | PRN

## 2015-01-05 MED ORDER — ONDANSETRON HCL 4 MG/2ML IJ SOLN
INTRAMUSCULAR | Status: AC
  Filled 2015-01-05: qty 2

## 2015-01-05 MED ORDER — OXYCODONE-ACETAMINOPHEN 5-325 MG PO TABS: 1.0000 | ORAL_TABLET | ORAL | Status: DC | PRN

## 2015-01-05 MED ORDER — HYDROMORPHONE HCL 1 MG/ML IJ SOLN
INTRAMUSCULAR | Status: AC
  Filled 2015-01-05: qty 1

## 2015-01-05 MED ORDER — MIDAZOLAM HCL 2 MG/2ML IJ SOLN
INTRAMUSCULAR | Status: AC
  Filled 2015-01-05: qty 4

## 2015-01-05 MED ORDER — BUPIVACAINE-EPINEPHRINE (PF) 0.25% -1:200000 IJ SOLN
INTRAMUSCULAR | Status: AC
  Filled 2015-01-05: qty 180

## 2015-01-05 MED ORDER — LACTATED RINGERS IV SOLN
INTRAVENOUS | Status: DC
  Administered 2015-01-05 (×2): via INTRAVENOUS

## 2015-01-05 SURGICAL SUPPLY — 47 items
APPLIER CLIP 9.375 MED OPEN (MISCELLANEOUS)
BINDER BREAST LRG (GAUZE/BANDAGES/DRESSINGS) ×2 IMPLANT
BINDER BREAST MEDIUM (GAUZE/BANDAGES/DRESSINGS) IMPLANT
BINDER BREAST XLRG (GAUZE/BANDAGES/DRESSINGS) IMPLANT
BINDER BREAST XXLRG (GAUZE/BANDAGES/DRESSINGS) IMPLANT
BLADE SURG 15 STRL LF DISP TIS (BLADE) ×1 IMPLANT
BLADE SURG 15 STRL SS (BLADE) ×1
CANISTER SUC SOCK COL 7IN (MISCELLANEOUS) ×2 IMPLANT
CANISTER SUCT 1200ML W/VALVE (MISCELLANEOUS) IMPLANT
CHLORAPREP W/TINT 26ML (MISCELLANEOUS) ×2 IMPLANT
CLIP APPLIE 9.375 MED OPEN (MISCELLANEOUS) IMPLANT
COVER BACK TABLE 60X90IN (DRAPES) ×2 IMPLANT
COVER MAYO STAND STRL (DRAPES) ×2 IMPLANT
COVER PROBE W GEL 5X96 (DRAPES) ×2 IMPLANT
DECANTER SPIKE VIAL GLASS SM (MISCELLANEOUS) IMPLANT
DEVICE DUBIN W/COMP PLATE 8390 (MISCELLANEOUS) ×2 IMPLANT
DRAPE LAPAROSCOPIC ABDOMINAL (DRAPES) IMPLANT
DRAPE LAPAROTOMY 100X72 PEDS (DRAPES) ×2 IMPLANT
DRAPE UTILITY XL STRL (DRAPES) ×2 IMPLANT
ELECT COATED BLADE 2.86 ST (ELECTRODE) ×2 IMPLANT
ELECT REM PT RETURN 9FT ADLT (ELECTROSURGICAL) ×2
ELECTRODE REM PT RTRN 9FT ADLT (ELECTROSURGICAL) ×1 IMPLANT
GLOVE BIO SURGEON STRL SZ 6.5 (GLOVE) ×2 IMPLANT
GLOVE BIOGEL PI IND STRL 7.0 (GLOVE) ×2 IMPLANT
GLOVE BIOGEL PI IND STRL 8 (GLOVE) ×1 IMPLANT
GLOVE BIOGEL PI INDICATOR 7.0 (GLOVE) ×2
GLOVE BIOGEL PI INDICATOR 8 (GLOVE) ×1
GLOVE ECLIPSE 8.0 STRL XLNG CF (GLOVE) ×2 IMPLANT
GOWN STRL REUS W/ TWL LRG LVL3 (GOWN DISPOSABLE) ×2 IMPLANT
GOWN STRL REUS W/TWL LRG LVL3 (GOWN DISPOSABLE) ×2
HEMOSTAT SNOW SURGICEL 2X4 (HEMOSTASIS) IMPLANT
KIT MARKER MARGIN INK (KITS) ×2 IMPLANT
LIQUID BAND (GAUZE/BANDAGES/DRESSINGS) ×2 IMPLANT
NEEDLE HYPO 25X1 1.5 SAFETY (NEEDLE) ×2 IMPLANT
NS IRRIG 1000ML POUR BTL (IV SOLUTION) ×2 IMPLANT
PACK BASIN DAY SURGERY FS (CUSTOM PROCEDURE TRAY) ×2 IMPLANT
PENCIL BUTTON HOLSTER BLD 10FT (ELECTRODE) ×2 IMPLANT
SLEEVE SCD COMPRESS KNEE MED (MISCELLANEOUS) ×2 IMPLANT
SPONGE LAP 4X18 X RAY DECT (DISPOSABLE) ×2 IMPLANT
SUT MNCRL AB 4-0 PS2 18 (SUTURE) ×2 IMPLANT
SUT SILK 2 0 SH (SUTURE) IMPLANT
SUT VICRYL 3-0 CR8 SH (SUTURE) ×2 IMPLANT
SYR CONTROL 10ML LL (SYRINGE) ×2 IMPLANT
TOWEL OR 17X24 6PK STRL BLUE (TOWEL DISPOSABLE) ×2 IMPLANT
TOWEL OR NON WOVEN STRL DISP B (DISPOSABLE) ×2 IMPLANT
TUBE CONNECTING 20X1/4 (TUBING) IMPLANT
YANKAUER SUCT BULB TIP NO VENT (SUCTIONS) IMPLANT

## 2015-01-05 NOTE — Anesthesia Postprocedure Evaluation (Signed)
  Anesthesia Post-op Note  Patient: Brooke Ferguson  Procedure(s) Performed: Procedure(s): BREAST LUMPECTOMY WITH RADIOACTIVE SEED LOCALIZATION (Left)  Patient Location: PACU  Anesthesia Type: General   Level of Consciousness: awake, alert  and oriented  Airway and Oxygen Therapy: Patient Spontanous Breathing  Post-op Pain: mild  Post-op Assessment: Post-op Vital signs reviewed  Post-op Vital Signs: Reviewed  Last Vitals:  Filed Vitals:   01/05/15 0914  BP: 126/85  Pulse: 71  Temp: 36.9 C  Resp: 16    Complications: No apparent anesthesia complications

## 2015-01-05 NOTE — Anesthesia Procedure Notes (Signed)
Procedure Name: LMA Insertion Date/Time: 01/05/2015 7:38 AM Performed by: Toula Moos L Pre-anesthesia Checklist: Patient identified, Emergency Drugs available, Suction available, Patient being monitored and Timeout performed Patient Re-evaluated:Patient Re-evaluated prior to inductionOxygen Delivery Method: Circle System Utilized Preoxygenation: Pre-oxygenation with 100% oxygen Intubation Type: IV induction Ventilation: Mask ventilation without difficulty LMA: LMA inserted LMA Size: 4.0 Number of attempts: 1 Airway Equipment and Method: Bite block Placement Confirmation: positive ETCO2 Tube secured with: Tape Dental Injury: Teeth and Oropharynx as per pre-operative assessment

## 2015-01-05 NOTE — H&P (Signed)
H&P   Brooke Ferguson (MR# [id number])      H&P Info    Author Note Status Last Update User Last Update Date/Time   Erroll Luna, MD Signed Erroll Luna, MD 11/25/2014 10:28 AM    H&P    Expand All Collapse All   Brooke Ferguson 11/25/2014 10:02 AM Location: [facility] Patient #: [id number] DOB: [birth year] Married / Language: Brooke Ferguson / Race: White Female History of Present Illness Brooke Ferguson A. Brooke Blish MD; 11/25/2014 10:27 AM) Patient words: breast f/u   : 44 y.o. BRCA negative [place] woman status post right breast upper outer quadrant biopsy 04/22/2014 for a clinical T1b N0, stage IA Invasive ductal carcinoma, grade 1, strongly estrogen and progesterone receptor positive, HER-2 negative, with an MIB-1 of 12%   left breast UOQ biopsy 10/28/2014 shows complex sclerosing lesion   Pt has developed a new mammographic abnormality on her left breast that has been core biopsied as sclerosing lesion. She is doing well on tamoxifen except for night sweats.           Breast, left, needle core biopsy, 2 o'clock - COMPLEX SCLEROSING LESION, SEE COMMENT   CLINICAL DATA: Post biopsy of a cluster of a mixed cystic and solid mass/cluster of cysts in the upper-outer left breast. EXAM: DIAGNOSTIC LEFT MAMMOGRAM POST ULTRASOUND BIOPSY COMPARISON: Previous exam(s). FINDINGS: Mammographic images were obtained following ultrasound guided biopsy of the cystic and solid mass in the upper-outer left breast. A ribbon shaped biopsy marking clip is present in the targeted region of the biopsied mass. IMPRESSION: Appropriate clip position post biopsy of a cystic and solid mass in the upper-outer left breast. Final Assessment: Post Procedure Mammograms for Marker Placement Electronically Signed By: Brooke Ferguson M.D. On: 10/28/2014 13:06      CLINICAL DATA: 44 year old female with history of right breast cancer post lumpectomy 05/17/2014 followed by  radiation therapy.  EXAM: DIGITAL DIAGNOSTIC BILATERAL MAMMOGRAM WITH 3D TOMOSYNTHESIS WITH CAD  ULTRASOUND LEFT BREAST  COMPARISON: Previous exam(s).  ACR Breast Density Category c: The breast tissue is heterogeneously dense, which may obscure small masses.  FINDINGS: No suspicious masses or calcifications are seen in the right breast. Postsurgical changes are present in the upper far outer right breast related to interval lumpectomy. Spot compression magnification tangential view of the lumpectomy site as well as spot compression XCCL tomosynthesis of the lumpectomy site was performed. There is no mammographic evidence of locally recurrent malignancy. There is a lobulated mass with irregular margins in the upper-outer left breast measuring approximately 2.4 cm.  Mammographic images were processed with CAD.  Physical examination of the upper-outer left breast is not reveal any palpable masses.  Targeted ultrasound of the left breast was performed demonstrating a cluster of cysts at 2 o'clock 5 cm from the nipple, with a larger of the cysts measuring 0.7 by 0.3 x 0.5 cm. This accounts for the mass seen at mammography.  IMPRESSION: 1. Postsurgical changes in the right breast. No mammographic evidence of malignancy in the right breast.  2. Cluster of cysts in the upper-outer left breast.  RECOMMENDATION: Ultrasound-guided biopsy of the cluster of cysts in the upper-outer left breast is recommended. This will subsequently be performed and dictated separately.  I have discussed the findings and recommendations with the patient. Results were also provided in writing at the conclusion of the visit. If applicable, a reminder letter will be sent to the patient regarding the next appointment.  BI-RADS CATEGORY Category 4A: Low suspicion for malignancy.  Electronically Signed By: Brooke Ferguson M.D.  The patient is a 44 year old female   Allergies (Brooke  Eversole, LPN; 03/18/5206 02:23 AM) Vicodin *ANALGESICS - OPIOID*  Medication History (Brooke Eversole, LPN; 3/61/2244 97:53 AM) Advil (200MG Capsule, Oral prn) Active. Tamoxifen Citrate (20MG Tablet, Oral) Active. Medications Reconciled    Vitals (Brooke Eversole LPN; 0/07/1100 11:17 AM) 11/25/2014 10:02 AM Weight: 176 lb Height: 66in Body Surface Area: 1.93 m Body Mass Index: 28.41 kg/m Temp.: 98.53F(Oral)  Pulse: 72 (Regular)  BP: 142/80 (Sitting, Left Arm, Standard)     Physical Exam (Brooke Ferguson Stage MD; 11/25/2014 10:28 AM)  General Mental Status-Alert. General Appearance-Consistent with stated age. Hydration-Well hydrated. Voice-Normal.  Head and Neck Head-normocephalic, atraumatic with no lesions or palpable masses. Trachea-midline. Thyroid Gland Characteristics - normal size and consistency.  Chest and Lung Exam Chest and lung exam reveals -quiet, even and easy respiratory effort with no use of accessory muscles and on auscultation, normal breath sounds, no adventitious sounds and normal vocal resonance. Inspection Chest Wall - Normal. Back - normal.  Breast Note: no masses bilaterally left breast biopsy site noted right breast scar noted no masses post radiation changes   Lymphatic Head & Neck  General Head & Neck Lymphatics: Bilateral - Description - Normal. Axillary  General Axillary Region: Bilateral - Description - Normal. Tenderness - Non Tender.    Assessment & Plan (Brooke Rengel A. Ashara Lounsbury MD; 11/25/2014 10:23 AM)  LEFT BREAST MASS (N63) Impression: complex sclerosing lesion recommend left breast seed localized lumpectomy for small risk of malignancy Risk of lumpectomy include bleeding, infection, seroma, more surgery, use of seed/wire, wound care, cosmetic deformity and the need for other treatments, death , blood clots, death. Pt agrees to proceed.  Current Plans Pt Education - [facility] Breast Biopsy HCI: discussed with  patient and provided information.   The anatomy and the physiology was discussed. The pathophysiology and natural history of the disease was discussed. Options were discussed and recommendations were made. Technique, risks, benefits, & alternatives were discussed. Risks such as stroke, heart attack, bleeding, indection, death, and other risks discussed. Questions answered. The patient agrees to proceed. HISTORY OF RIGHT BREAST CANCER (Z85.3) Impression: stable on tamoxifen

## 2015-01-05 NOTE — Transfer of Care (Signed)
Immediate Anesthesia Transfer of Care Note  Patient: [name]  Procedure(s) Performed: Procedure(s): BREAST LUMPECTOMY WITH RADIOACTIVE SEED LOCALIZATION (Left)  Patient Location: PACU  Anesthesia Type:General  Level of Consciousness: awake and patient cooperative  Airway & Oxygen Therapy: Patient Spontanous Breathing and Patient connected to face mask oxygen  Post-op Assessment: Report given to RN and Post -op Vital signs reviewed and stable  Post vital signs: Reviewed and stable  Last Vitals:  Filed Vitals:   01/05/15 0618  BP: 123/84  Pulse: 83  Temp: 36.9 C  Resp: 16    Complications: No apparent anesthesia complications

## 2015-01-05 NOTE — Interval H&P Note (Signed)
History and Physical Interval Note:  01/05/2015 7:15 AM  Brooke Ferguson  has presented today for surgery, with the diagnosis of Left Breast Lesion  The various methods of treatment have been discussed with the patient and family. After consideration of risks, benefits and other options for treatment, the patient has consented to  Procedure(s): BREAST LUMPECTOMY WITH RADIOACTIVE SEED LOCALIZATION (Left) as a surgical intervention .  The patient's history has been reviewed, patient examined, no change in status, stable for surgery.  I have reviewed the patient's chart and labs.  Questions were answered to the patient's satisfaction.     [name]

## 2015-01-05 NOTE — Discharge Instructions (Signed)
[facility] °Office Phone Number [phone] ° °BREAST BIOPSY/ PARTIAL MASTECTOMY: POST OP INSTRUCTIONS ° °Always review your discharge instruction sheet given to you by the facility where your surgery was performed. ° °IF YOU HAVE DISABILITY OR FAMILY LEAVE FORMS, YOU MUST BRING THEM TO THE OFFICE FOR PROCESSING.  DO NOT GIVE THEM TO YOUR DOCTOR. ° °1. A prescription for pain medication may be given to you upon discharge.  Take your pain medication as prescribed, if needed.  If narcotic pain medicine is not needed, then you may take acetaminophen (Tylenol) or ibuprofen (Advil) as needed. °2. Take your usually prescribed medications unless otherwise directed °3. If you need a refill on your pain medication, please contact your pharmacy.  They will contact our office to request authorization.  Prescriptions will not be filled after 5pm or on week-ends. °4. You should eat very light the first 24 hours after surgery, such as soup, crackers, pudding, etc.  Resume your normal diet the day after surgery. °5. Most patients will experience some swelling and bruising in the breast.  Ice packs and a good support bra will help.  Swelling and bruising can take several days to resolve.  °6. It is common to experience some constipation if taking pain medication after surgery.  Increasing fluid intake and taking a stool softener will usually help or prevent this problem from occurring.  A mild laxative (Milk of Magnesia or Miralax) should be taken according to package directions if there are no bowel movements after 48 hours. °7. Unless discharge instructions indicate otherwise, you may remove your bandages 24-48 hours after surgery, and you may shower at that time.  You may have steri-strips (small skin tapes) in place directly over the incision.  These strips should be left on the skin for 7-10 days.  If your surgeon used skin glue on the incision, you may shower in 24 hours.  The glue will flake off over the  next 2-3 weeks.  Any sutures or staples will be removed at the office during your follow-up visit. °8. ACTIVITIES:  You may resume regular daily activities (gradually increasing) beginning the next day.  Wearing a good support bra or sports bra minimizes pain and swelling.  You may have sexual intercourse when it is comfortable. °a. You may drive when you no longer are taking prescription pain medication, you can comfortably wear a seatbelt, and you can safely maneuver your car and apply brakes. °b. RETURN TO WORK:  ______________________________________________________________________________________ °9. You should see your doctor in the office for a follow-up appointment approximately two weeks after your surgery.  Your doctor’s nurse will typically make your follow-up appointment when she calls you with your pathology report.  Expect your pathology report 2-3 business days after your surgery.  You may call to check if you do not hear from us after three days. °10. OTHER INSTRUCTIONS: _______________________________________________________________________________________________ _____________________________________________________________________________________________________________________________________ °_____________________________________________________________________________________________________________________________________ °_____________________________________________________________________________________________________________________________________ ° °WHEN TO CALL YOUR DOCTOR: °1. Fever over 101.0 °2. Nausea and/or vomiting. °3. Extreme swelling or bruising. °4. Continued bleeding from incision. °5. Increased pain, redness, or drainage from the incision. ° °The clinic staff is available to answer your questions during regular business hours.  Please don’t hesitate to call and ask to speak to one of the nurses for clinical concerns.  If you have a medical emergency, go to the nearest  emergency room or call 911.  A surgeon from [facility] is always on call at the hospital. ° °For further questions, please visit [email or web address]  ° ° ° °  Post Anesthesia Home Care Instructions ° °Activity: °Get plenty of rest for the remainder of the day. A responsible adult should stay with you for 24 hours following the procedure.  °For the next 24 hours, DO NOT: °-Drive a car °-Operate machinery °-Drink alcoholic beverages °-Take any medication unless instructed by your physician °-Make any legal decisions or sign important papers. ° °Meals: °Start with liquid foods such as gelatin or soup. Progress to regular foods as tolerated. Avoid greasy, spicy, heavy foods. If nausea and/or vomiting occur, drink only clear liquids until the nausea and/or vomiting subsides. Call your physician if vomiting continues. ° °Special Instructions/Symptoms: °Your throat may feel dry or sore from the anesthesia or the breathing tube placed in your throat during surgery. If this causes discomfort, gargle with warm salt water. The discomfort should disappear within 24 hours. ° °If you had a scopolamine patch placed behind your ear for the management of post- operative nausea and/or vomiting: ° °1. The medication in the patch is effective for 72 hours, after which it should be removed.  Wrap patch in a tissue and discard in the trash. Wash hands thoroughly with soap and water. °2. You may remove the patch earlier than 72 hours if you experience unpleasant side effects which may include dry mouth, dizziness or visual disturbances. °3. Avoid touching the patch. Wash your hands with soap and water after contact with the patch. °  ° °

## 2015-01-05 NOTE — Op Note (Signed)
Preoperative diagnosis: Left breast sclerosing lesion  Postoperative diagnosis: Same   Procedure: Left breast seed localized lumpectomy  Surgeon: Erroll Luna M.D.  Anesthesia: Gen. With 0.25% Sensorcaine local  EBL: 20 cc  Specimen: Left breast tissue with clip and radioactive seed in the specimen. Verified with neoprobe and radiographic image showing both seed and clip in specimen  Indications for procedure: The patient presents for left breast  lumpectomy after core biopsy showed sclerosing lesion. Discussed the rationale for considering excision. Small risk of malignancy associated with lesion after core biopsy. Discussed observation. Discussed wire localization. Patient desired excision of left breast lesion.The procedure has been discussed with the patient. Alternatives to surgery have been discussed with the patient.  Risks of surgery include bleeding,  Infection,  Seroma formation, death,  and the need for further surgery.   The patient understands and wishes to proceed.   Description of procedure: Patient underwent seed placement as an outpatient. Patient presents today for left breast seed localized lumpectomy. Patient seen in  holding area. Questions are answered and neoprobe used to verify seed location. Patient taken back to the operating room and placed upon the OR table. After induction of general anesthesia, left breast prepped and draped in a sterile fashion. Timeout was done to verify proper  procedure. Neoprobe used and hot spot identified and left breast upper-outer quadrant. This was marked with pen. Curvilinear incision made left upper outer quadrant breast. Dissection used with the help of a neoprobe around the tissue where the seed and clip were located. Tissue removed in its entirety with gross margins.Marlis Edelson used and seen within specimen. Radiographs taken which show clip and seed  In specimen.hemostasis achieved and cavity closed with 3-0 Vicryl and 4-0 Monocryl.  Dermabond applied. All final counts found to be correct. Specimen transported to pathology. Patient awoke extubated taken to recovery in satisfactory condition.

## 2015-01-05 NOTE — Anesthesia Preprocedure Evaluation (Signed)
Anesthesia Evaluation  Patient identified by MRN, date of birth, ID band Patient awake    Reviewed: Allergy & Precautions, NPO status , Patient's Chart, lab work & pertinent test results  Airway Mallampati: I  TM Distance: >3 FB Neck ROM: Full    Dental  (+) Teeth Intact, Dental Advisory Given   Pulmonary    breath sounds clear to auscultation       Cardiovascular  Rhythm:Regular Rate:Normal     Neuro/Psych    GI/Hepatic   Endo/Other    Renal/GU      Musculoskeletal   Abdominal   Peds  Hematology   Anesthesia Other Findings   Reproductive/Obstetrics                             Anesthesia Physical Anesthesia Plan  ASA: II  Anesthesia Plan: General   Post-op Pain Management:    Induction: Intravenous  Airway Management Planned:   Additional Equipment:   Intra-op Plan:   Post-operative Plan: Extubation in OR  Informed Consent: I have reviewed the patients History and Physical, chart, labs and discussed the procedure including the risks, benefits and alternatives for the proposed anesthesia with the patient or authorized representative who has indicated his/her understanding and acceptance.   Dental advisory given  Plan Discussed with: CRNA, Anesthesiologist and Surgeon  Anesthesia Plan Comments:         Anesthesia Quick Evaluation

## 2015-01-06 ENCOUNTER — Encounter (HOSPITAL_BASED_OUTPATIENT_CLINIC_OR_DEPARTMENT_OTHER): Payer: Self-pay | Admitting: Surgery

## 2015-02-06 ENCOUNTER — Other Ambulatory Visit (HOSPITAL_BASED_OUTPATIENT_CLINIC_OR_DEPARTMENT_OTHER): Payer: No Typology Code available for payment source

## 2015-02-06 DIAGNOSIS — C50411 Malignant neoplasm of upper-outer quadrant of right female breast: Secondary | ICD-10-CM | POA: Diagnosis not present

## 2015-02-06 LAB — COMPREHENSIVE METABOLIC PANEL
ALBUMIN: 3.6 g/dL (ref 3.5–5.0)
ALT: 17 U/L (ref 0–55)
AST: 19 U/L (ref 5–34)
Alkaline Phosphatase: 44 U/L (ref 40–150)
Anion Gap: 10 mEq/L (ref 3–11)
BILIRUBIN TOTAL: 0.68 mg/dL (ref 0.20–1.20)
BUN: 12.9 mg/dL (ref 7.0–26.0)
CO2: 21 meq/L — AB (ref 22–29)
CREATININE: 0.8 mg/dL (ref 0.6–1.1)
Calcium: 9.1 mg/dL (ref 8.4–10.4)
Chloride: 108 mEq/L (ref 98–109)
EGFR: 90 mL/min/{1.73_m2} — ABNORMAL LOW (ref >90)
GLUCOSE: 115 mg/dL (ref 70–140)
Potassium: 4 mEq/L (ref 3.5–5.1)
SODIUM: 139 meq/L (ref 136–145)
TOTAL PROTEIN: 6.9 g/dL (ref 6.4–8.3)

## 2015-02-06 LAB — CBC WITH DIFFERENTIAL/PLATELET
BASO%: 0.6 % (ref 0.0–2.0)
Basophils Absolute: 0 10*3/uL (ref 0.0–0.1)
EOS%: 3.5 % (ref 0.0–7.0)
Eosinophils Absolute: 0.1 10*3/uL (ref 0.0–0.5)
HCT: 40.5 % (ref 34.8–46.6)
HEMOGLOBIN: 13.6 g/dL (ref 11.6–15.9)
LYMPH%: 35.1 % (ref 14.0–49.7)
MCH: 32.8 pg (ref 25.1–34.0)
MCHC: 33.5 g/dL (ref 31.5–36.0)
MCV: 98.1 fL (ref 79.5–101.0)
MONO#: 0.3 10*3/uL (ref 0.1–0.9)
MONO%: 8.2 % (ref 0.0–14.0)
NEUT%: 52.6 % (ref 38.4–76.8)
NEUTROS ABS: 2.1 10*3/uL (ref 1.5–6.5)
Platelets: 304 10*3/uL (ref 145–400)
RBC: 4.13 10*6/uL (ref 3.70–5.45)
RDW: 12.9 % (ref 11.2–14.5)
WBC: 4 10*3/uL (ref 3.9–10.3)
lymph#: 1.4 10*3/uL (ref 0.9–3.3)

## 2015-02-06 LAB — FOLLICLE STIMULATING HORMONE: FSH: 5.6 m[IU]/mL

## 2015-02-10 LAB — ESTRADIOL, ULTRA SENS: ESTRADIOL, ULTRA SENSITIVE: 740 pg/mL

## 2015-02-13 ENCOUNTER — Telehealth: Payer: Self-pay | Admitting: Oncology

## 2015-02-13 ENCOUNTER — Ambulatory Visit (HOSPITAL_BASED_OUTPATIENT_CLINIC_OR_DEPARTMENT_OTHER): Payer: No Typology Code available for payment source | Admitting: Nurse Practitioner

## 2015-02-13 ENCOUNTER — Encounter: Payer: Self-pay | Admitting: Nurse Practitioner

## 2015-02-13 VITALS — BP 141/75 | HR 85 | Temp 98.6°F | Resp 18 | Ht 66.0 in | Wt 182.0 lb

## 2015-02-13 DIAGNOSIS — C50411 Malignant neoplasm of upper-outer quadrant of right female breast: Secondary | ICD-10-CM

## 2015-02-13 DIAGNOSIS — N951 Menopausal and female climacteric states: Secondary | ICD-10-CM

## 2015-02-13 DIAGNOSIS — R232 Flushing: Secondary | ICD-10-CM

## 2015-02-13 NOTE — Progress Notes (Signed)
[facility]  Telephone:([phone]) [phone] Fax:(336) [phone]     ID: Brooke Ferguson DOB: Mar 12, 1970  MR#: [id number]  TML#:[id number]  Patient Care Team: Brien Few, MD as PCP - General (Obstetrics and Gynecology) Erroll Luna, MD as Consulting Physician (General Surgery) Chauncey Cruel, MD as Consulting Physician (Oncology) Arloa Koh, MD as Consulting Physician (Radiation Oncology) Rockwell Germany, RN as Registered Nurse Mauro Kaufmann, RN as Registered Nurse Holley Bouche, NP as Nurse Practitioner (Nurse Practitioner) Brien Few, MD as Consulting Physician (Obstetrics and Gynecology) Sylvan Cheese, NP as Nurse Practitioner (Nurse Practitioner) OTHER MD: Jamal Maes MD  CHIEF COMPLAINT: early-stage estrogen receptor positive breast cancer  CURRENT TREATMENT: tamoxifen   BREAST CANCER HISTORY: From the original intake note:  Brooke Ferguson has received yearly mammography since age 5. She has breast density category C. On 09/30/2013 bilateral screening mammography showed a possible asymmetry in the right breast. Right diagnostic mammography with tomosynthesis and right breast ultrasonography was performed 10/08/2013. On spot compression views there appears to be a persistent mixed attenuation mass in the right breast suggesting an intramammary lymph node. Ultrasound showed several small cysts not related to this finding. There was a diagnosed in her graphic abnormality noted associated with a possible abnormality.   Accordingly 6 month follow-up was suggested and performed 04/12/2014. On this date right diagnostic mammography and ultrasonography showed an oval 5 mm mass with indistinct med margins in the upper right breast. This was not palpable. Targeted ultrasound now found a hypoechoic mass measuring 5 mm in the area in question, and this was biopsied 04/22/2014. The pathology showed ([id number]) and invasive ductal carcinoma, grade 1, estrogen  receptor 100% positive, progesterone receptor 97% positive, both with strong staining intensity, with an MIB-1 of 12%, and no HER-2 amplification, the signals ratio being 1.21 and the number per cell 2.00.  On 05/02/2014 the patient underwent bilateral breast MRI. This found the breast density to be category B. In the upper outer quadrant of the right breast there was a 6 mm enhancing mass with slightly irregular margins. There were no abnormal lymph nodes and the left breast was unremarkable. In the upper abdomen and left upper quadrant there were innumerable cysts, some measuring up to 6 cm in size.  The patient's subsequent history is as detailed below.  INTERVAL HISTORY: Brooke Ferguson returns today for follow-up of her early stage breast cancer.. She has been on tamoxifen since late June 2016 and is tolerating this well. She has night sweats but manages these on her own. Her vaginal wetness is not of great concern. The interval history is remarkable for a cluster of cysts seen on her left breast during a mammogram in August. The biopsy reveled a sclerosis lesion and she had a lumpectomy in early November. This returned with benign pathology. She is healing well from surgery but still has some tenderness to this site.  REVIEW OF SYSTEMS: She continues to have periods, though irregular. A detailed review of systems was otherwise entirely negative, except where noted above.  PAST MEDICAL HISTORY: Past Medical History  Diagnosis Date  . Bulging disc     BETWEEN C6-7, HAS HAD 2 CORTISONE INJECTIONS  . 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 Breast cancer of upper-outer quadrant of right female breast ([facility]) 04/26/2014  . History of lumpectomy of right breast 05/17/14    With sentinel node biopsy  . S/P radiation therapy 06/21/2014 through 07/13/2014  Right breast 4250 cGy in 17 sessions     PAST SURGICAL  HISTORY: Past Surgical History  Procedure Laterality Date  . Hysteroscopy with resectoscope  10/25/2010    Procedure: HYSTEROSCOPY WITH RESECTOSCOPE;  Surgeon: Lovenia Kim, MD;  Location: [facility];  Service: Gynecology;  Laterality: N/A;  Polypectomy  . Wisdom tooth extraction    . Dilation and curettage of uterus    . Diagnostic laparoscopy      OVARIAN CYCTS AT AGE 76 YRS  . Breast lumpectomy with radioactive seed localization Left 01/05/2015    Procedure: BREAST LUMPECTOMY WITH RADIOACTIVE SEED LOCALIZATION;  Surgeon: Erroll Luna, MD;  Location: [facility];  Service: General;  Laterality: Left;    FAMILY HISTORY Family History  Problem Relation Age of Onset  . Cancer Mother     astrocytoma; deceased 57  . Breast cancer Maternal Grandmother     Great-Great Grandmotherlung; smoker; currently 35  . Cancer Other     colon; mat grandmother's sister  . Cancer Other     esophagus; mat grandmother's sister   The patient's father is alive, age 46 as of 2014/05/11. The patient's mother died at age 61 from an astrocytoma which was diagnosed a year earlier. The patient has no brothers, one sister. There is no history of breast cancer in the the immediate family. On the maternal side however the patient's mother's grandmother lived to be 69 but had been diagnosed with breast cancer at some point. There are also cases of lung cancer, esophageal cancer, and colon cancer on the mother's side of the family. There are no other breast or ovarian cancers to the patient's knowledge  GYNECOLOGIC HISTORY:  No LMP recorded. Menarche age 18. The patient is GX P0. She still having regular periods. She used oral contraceptives between the ages of 89 and 31. She underwent hysteroscopy with "scraping" for uterine polyps in 05-11-10. She also is status post partial oophorectomy on the right because of an ovarian cyst.--(At the 08/29/2014 visited the patient explained she has found further  information regarding her family. There is a cousin who developed breast cancer in her 52s. There is another cousin, but she does not know the age, undergoing dialysis. She does not know the reason for end-stage renal disease on that person  SOCIAL HISTORY:  She'll works as a Adult nurse. Gerald Stabs works for a [employer] account pl[employer] (they do a lot of the things testing for defibrillators.). A stepson, Zack room and, 94, lives with them. He is currently a Ship broker. The patient is not a church attender    ADVANCED DIRECTIVES: not in place   HEALTH MAINTENANCE: Social History  Substance Use Topics  . Smoking status: Never Smoker   . Smokeless tobacco: Never Used  . Alcohol Use: Yes     Comment: SOCIALLY     Colonoscopy:never  HQI:ONGEXB 2013/05/10  Bone density:never  Lipid panel:  Allergies  Allergen Reactions  . Vicodin [Hydrocodone-Acetaminophen] Nausea And Vomiting    Current Outpatient Prescriptions  Medication Sig Dispense Refill  . betamethasone dipropionate (DIPROLENE) 0.05 % cream Apply topically daily.    Marland Kitchen ibuprofen (ADVIL,MOTRIN) 800 MG tablet   0  . tamoxifen (NOLVADEX) 10 MG tablet Take 20 mg by mouth daily.    Marland Kitchen oxyCODONE-acetaminophen (ROXICET) 5-325 MG tablet Take 1-2 tablets by mouth every 4 (four) hours as needed. (Patient not taking: Reported on 02/13/2015) 30 tablet 0   No current facility-administered medications for this visit.    OBJECTIVE: young  White woman in no acute distress Filed Vitals:   02/13/15 0921  BP: 141/75  Pulse: 85  Temp: 98.6 F (37 C)  Resp: 18     Body mass index is 29.39 kg/(m^2).    ECOG FS:0 - Asymptomatic  Skin: warm, dry  HEENT: sclerae anicteric, conjunctivae pink, oropharynx clear. No thrush or mucositis.  Lymph Nodes: No cervical or supraclavicular lymphadenopathy  Lungs: clear to auscultation bilaterally, no rales, wheezes, or rhonci  Heart: regular rate and rhythm  Abdomen: round, soft, non tender, positive bowel sounds   Musculoskeletal: No focal spinal tenderness, no peripheral edema  Neuro: non focal, well oriented, positive affect  Breasts: right breast status post lumpectomy and radiation. No evidence of recurrent disease. Right axilla benign. Left breast status post recent mastectomy. Otherwise unremarkable.   LAB RESULTS:  CMP     Component Value Date/Time   NA 139 02/06/2015 0759   NA 141 03/18/2011 1200   K 4.0 02/06/2015 0759   K 3.7 03/18/2011 1200   CL 107 03/18/2011 1200   CO2 21* 02/06/2015 0759   CO2 25 03/18/2011 1200   GLUCOSE 115 02/06/2015 0759   GLUCOSE 91 03/18/2011 1200   BUN 12.9 02/06/2015 0759   BUN 14 03/18/2011 1200   CREATININE 0.8 02/06/2015 0759   CREATININE 0.70 03/18/2011 1200   CALCIUM 9.1 02/06/2015 0759   CALCIUM 9.2 03/18/2011 1200   PROT 6.9 02/06/2015 0759   PROT 6.5 03/18/2011 1200   ALBUMIN 3.6 02/06/2015 0759   ALBUMIN 3.5 03/18/2011 1200   AST 19 02/06/2015 0759   AST 13 03/18/2011 1200   ALT 17 02/06/2015 0759   ALT 11 03/18/2011 1200   ALKPHOS 44 02/06/2015 0759   ALKPHOS 42 03/18/2011 1200   BILITOT 0.68 02/06/2015 0759   BILITOT 0.3 03/18/2011 1200   GFRNONAA >90 03/18/2011 1200   GFRAA >90 03/18/2011 1200    INo results found for: SPEP, UPEP  Lab Results  Component Value Date   WBC 4.0 02/06/2015   NEUTROABS 2.1 02/06/2015   HGB 13.6 02/06/2015   HCT 40.5 02/06/2015   MCV 98.1 02/06/2015   PLT 304 02/06/2015      Chemistry      Component Value Date/Time   NA 139 02/06/2015 0759   NA 141 03/18/2011 1200   K 4.0 02/06/2015 0759   K 3.7 03/18/2011 1200   CL 107 03/18/2011 1200   CO2 21* 02/06/2015 0759   CO2 25 03/18/2011 1200   BUN 12.9 02/06/2015 0759   BUN 14 03/18/2011 1200   CREATININE 0.8 02/06/2015 0759   CREATININE 0.70 03/18/2011 1200      Component Value Date/Time   CALCIUM 9.1 02/06/2015 0759   CALCIUM 9.2 03/18/2011 1200   ALKPHOS 44 02/06/2015 0759   ALKPHOS 42 03/18/2011 1200   AST 19 02/06/2015 0759    AST 13 03/18/2011 1200   ALT 17 02/06/2015 0759   ALT 11 03/18/2011 1200   BILITOT 0.68 02/06/2015 0759   BILITOT 0.3 03/18/2011 1200       No results found for: LABCA2  No components found for: [id number]  No results for input(s): INR in the last 168 hours.  Urinalysis    Component Value Date/Time   COLORURINE YELLOW 03/18/2011 1322   APPEARANCEUR CLOUDY* 03/18/2011 1322   LABSPEC 1.026 03/18/2011 1322   PHURINE 6.0 03/18/2011 1322   GLUCOSEU NEGATIVE 03/18/2011 1322   HGBUR NEGATIVE 03/18/2011 1322   BILIRUBINUR SMALL* 03/18/2011 1322   KETONESUR >  80* 03/18/2011 1322   PROTEINUR NEGATIVE 03/18/2011 1322   UROBILINOGEN 0.2 03/18/2011 1322   NITRITE NEGATIVE 03/18/2011 1322   LEUKOCYTESUR NEGATIVE 03/18/2011 1322    STUDIES: No results found.  ASSESSMENT: 44 y.o. BRCA negative [place] woman status post right breast upper outer quadrant biopsy 04/22/2014 for a clinical T1b N0, stage IA  Invasive ductal carcinoma, grade 1, strongly estrogen and progesterone receptor positive, HER-2 negative, with an MIB-1 of 12%   (1) genetics testing using the [employer] gene panel/ [facility] showed no deleterious mutations in ATM, BARD1, BRCA1, BRCA2, BRIP1, CDH1, CHEK2, EPCAM, MLH1, MRE11A, MSH2, MSH6, MUTYH, NBN, NF1, PALB2, PMS2, PTEN, RAD50, RAD51C, RAD51D, SMARCA4, STK11, or TP53.   (2) right partial mastectomy with sentinel lymph node sampling 05/17/2014 showed a pT1b pN0, stage IA invasive ductal carcinoma, grade 1, with negative margins.   (3) Oncotype DX score of 9 predicts a 10 year risk of outside the breast recurrence of 6% if the patient's only systemic treatment is tamoxifen for 5 years. It also predicts no benefit from chemotherapy.   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 No malignancy.  PLAN: Brooke Ferguson looks and feels well today. The labs were reviewed in detail and were entirely normal. She is tolerating the tamoxifen well and wil continue this drug for 5-10 years of antiestrogen therapy as she is still premenopausal. She will continue to manage her hot flashes on her own, though she knows gabapentin is available if she needs.   Brooke Ferguson will return in 6 months for a follow up visit. Her next mammogram will be August 2017. She understands and agree with this plan. She knows the goal of treatment in her case is cure. She has been encouraged to call with any issues that might arise before her next visit here.  Laurie Panda, NP   02/13/2015 9:53 AM

## 2015-02-13 NOTE — Telephone Encounter (Signed)
Appointments made and avs printed for patient °

## 2015-02-14 ENCOUNTER — Other Ambulatory Visit: Payer: Self-pay | Admitting: Oncology

## 2015-03-02 ENCOUNTER — Encounter: Payer: Self-pay | Admitting: Adult Health

## 2015-03-02 NOTE — Progress Notes (Signed)
A birthday card was mailed to the patient today on behalf of the Survivorship Program at [facility].   Gretchen Dawson, NP Survivorship Program [facility] [phone]  

## 2015-08-07 ENCOUNTER — Telehealth: Payer: Self-pay

## 2015-08-07 NOTE — Telephone Encounter (Signed)
Pt called to let us know she set up a mail order pharmacy account and to be looking out for a form from them to start her tamoxifen on mail order.

## 2015-08-09 ENCOUNTER — Other Ambulatory Visit: Payer: Self-pay | Admitting: *Deleted

## 2015-08-09 MED ORDER — TAMOXIFEN CITRATE 20 MG PO TABS: 20.0000 mg | ORAL_TABLET | Freq: Every day | ORAL | Status: DC

## 2015-08-10 ENCOUNTER — Other Ambulatory Visit: Payer: Self-pay | Admitting: *Deleted

## 2015-08-10 DIAGNOSIS — C50411 Malignant neoplasm of upper-outer quadrant of right female breast: Secondary | ICD-10-CM

## 2015-08-10 MED ORDER — TAMOXIFEN CITRATE 20 MG PO TABS: 20.0000 mg | ORAL_TABLET | Freq: Every day | ORAL | Status: DC

## 2015-08-14 ENCOUNTER — Ambulatory Visit (HOSPITAL_BASED_OUTPATIENT_CLINIC_OR_DEPARTMENT_OTHER): Payer: No Typology Code available for payment source | Admitting: Oncology

## 2015-08-14 ENCOUNTER — Other Ambulatory Visit: Payer: No Typology Code available for payment source

## 2015-08-14 VITALS — BP 131/96 | HR 90 | Temp 98.9°F | Resp 18 | Ht 66.0 in | Wt 169.3 lb

## 2015-08-14 DIAGNOSIS — C50411 Malignant neoplasm of upper-outer quadrant of right female breast: Secondary | ICD-10-CM | POA: Diagnosis not present

## 2015-08-14 DIAGNOSIS — Z170 Estrogen receptor positive status [ER+]: Secondary | ICD-10-CM | POA: Diagnosis not present

## 2015-08-14 DIAGNOSIS — R61 Generalized hyperhidrosis: Secondary | ICD-10-CM

## 2015-08-14 MED ORDER — GABAPENTIN 300 MG PO CAPS: 300.0000 mg | ORAL_CAPSULE | Freq: Every day | ORAL | Status: DC

## 2015-08-14 NOTE — Progress Notes (Signed)
[facility]  [phone]) [phone] [phone]) [phone]     ID: Brooke Ferguson DOB: [birth year]  MR#: [id number]  FFM#:[id number]  Patient Care Team: Brien Few, MD as PCP - General (Obstetrics and Gynecology) Erroll Luna, MD as Consulting Physician (General Surgery) Chauncey Cruel, MD as Consulting Physician (Oncology) Arloa Koh, MD as Consulting Physician (Radiation Oncology) Rockwell Germany, RN as Registered Nurse Mauro Kaufmann, RN as Registered Nurse Holley Bouche, NP as Nurse Practitioner (Nurse Practitioner) Brien Few, MD as Consulting Physician (Obstetrics and Gynecology) Sylvan Cheese, NP as Nurse Practitioner (Nurse Practitioner) OTHER MD: Jamal Maes MD  CHIEF COMPLAINT: early-stage estrogen receptor positive breast cancer  CURRENT TREATMENT: tamoxifen   BREAST CANCER HISTORY: From the original intake note:  Brooke Ferguson has received yearly mammography since age 89. She has breast density category C. On 09/30/2013 bilateral screening mammography showed a possible asymmetry in the right breast. Right diagnostic mammography with tomosynthesis and right breast ultrasonography was performed 10/08/2013. On spot compression views there appears to be a persistent mixed attenuation mass in the right breast suggesting an intramammary lymph node. Ultrasound showed several small cysts not related to this finding. There was a diagnosed in her graphic abnormality noted associated with a possible abnormality.   Accordingly 6 month follow-up was suggested and performed 04/12/2014. On this date right diagnostic mammography and ultrasonography showed an oval 5 mm mass with indistinct med margins in the upper right breast. This was not palpable. Targeted ultrasound now found a hypoechoic mass measuring 5 mm in the area in question, and this was biopsied 04/22/2014. The pathology showed (SAA [phone]) and invasive ductal carcinoma, grade 1, estrogen  receptor 100% positive, progesterone receptor 97% positive, both with strong staining intensity, with an MIB-1 of 12%, and no HER-2 amplification, the signals ratio being 1.21 and the number per cell 2.00.  On 05/02/2014 the patient underwent bilateral breast MRI. This found the breast density to be category B. In the upper outer quadrant of the right breast there was a 6 mm enhancing mass with slightly irregular margins. There were no abnormal lymph nodes and the left breast was unremarkable. In the upper abdomen and left upper quadrant there were innumerable cysts, some measuring up to 6 cm in size.  The patient's subsequent history is as detailed below.  INTERVAL HISTORY: Brooke Ferguson returns today for follow-up of her estrogen receptor positive breast cancer..she continues on tamoxifen, generally with good tolerance. Hot flashes during the day are not a big problem but they are at night. They do wake her up. She does drench her clothes. She does not have vaginal wetness issues. She obtains a drug at a good price.  REVIEW OF SYSTEMS: Brooke Ferguson is still having periods, although she is status post ablation. She is not using contraception because she feels her chance of getting pregnant is close to 0. She is not exercising regularly. A detailed review of systems today was otherwise stable  PAST MEDICAL HISTORY: Past Medical History  Diagnosis Date  . Bulging disc     BETWEEN C6-7, HAS HAD 2 CORTISONE INJECTIONS  . 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 Breast cancer of upper-outer quadrant of right female breast ([place]) 04/26/2014  . History of lumpectomy of right breast 05/17/14    With sentinel node biopsy  . S/P radiation therapy 06/21/2014 through 07/13/2014     Right breast 4250 cGy in 17 sessions     PAST SURGICAL HISTORY: Past  Surgical History  Procedure Laterality Date  . Hysteroscopy with resectoscope   10/25/2010    Procedure: HYSTEROSCOPY WITH RESECTOSCOPE;  Surgeon: Lovenia Kim, MD;  Location: [facility];  Service: Gynecology;  Laterality: N/A;  Polypectomy  . Wisdom tooth extraction    . Dilation and curettage of uterus    . Diagnostic laparoscopy      OVARIAN CYCTS AT AGE 16 YRS  . Breast lumpectomy with radioactive seed localization Left 01/05/2015    Procedure: BREAST LUMPECTOMY WITH RADIOACTIVE SEED LOCALIZATION;  Surgeon: Erroll Luna, MD;  Location: [facility];  Service: General;  Laterality: Left;    FAMILY HISTORY Family History  Problem Relation Age of Onset  . Cancer Mother     astrocytoma; deceased 66  . Breast cancer Maternal Grandmother     Great-Great Grandmotherlung; smoker; currently 8  . Cancer Other     colon; mat grandmother's sister  . Cancer Other     esophagus; mat grandmother's sister   The patient's father is alive, age 88 as of 2014-05-26. The patient's mother died at age 72 from an astrocytoma which was diagnosed a year earlier. The patient has no brothers, one sister. There is no history of breast cancer in the the immediate family. On the maternal side however the patient's mother's grandmother lived to be 50 but had been diagnosed with breast cancer at some point. There are also cases of lung cancer, esophageal cancer, and colon cancer on the mother's side of the family. There are no other breast or ovarian cancers to the patient's knowledge  GYNECOLOGIC HISTORY:  No LMP recorded. Menarche age 73. The patient is GX P0. She still having regular periods. She used oral contraceptives between the ages of 61 and 65. She underwent hysteroscopy with "scraping" for uterine polyps in May 26, 2010. She also is status post partial oophorectomy on the right because of an ovarian cyst.--(At the 08/29/2014 visited the patient explained she has found further information regarding her family. There is a cousin who developed breast cancer in her 34s. There is  another cousin, but she does not know the age, undergoing dialysis. She does not know the reason for end-stage renal disease on that person  SOCIAL HISTORY: (updated June 2017) She'll works as a Adult nurse. Gerald Stabs works for a [employer]vertising account pl[employer] (they do a lot of the things testing for defibrillators.). A stepson, Zack, 30, lives with them. He is currently a Ship broker at [employer] and has a job in [employer]. The patient is not a church attender    ADVANCED DIRECTIVES: not in place   HEALTH MAINTENANCE: Social History  Substance Use Topics  . Smoking status: Never Smoker   . Smokeless tobacco: Never Used  . Alcohol Use: Yes     Comment: SOCIALLY     Colonoscopy:never  PPH:KFEXMD 2013-05-25  Bone density:never  Lipid panel:  Allergies  Allergen Reactions  . Vicodin [Hydrocodone-Acetaminophen] Nausea And Vomiting    Current Outpatient Prescriptions  Medication Sig Dispense Refill  . betamethasone dipropionate (DIPROLENE) 0.05 % cream Apply topically daily.    Marland Kitchen gabapentin (NEURONTIN) 300 MG capsule Take 1 capsule (300 mg total) by mouth at bedtime. 90 capsule 4  . ibuprofen (ADVIL,MOTRIN) 800 MG tablet   0  . tamoxifen (NOLVADEX) 20 MG tablet Take 1 tablet (20 mg total) by mouth daily. 90 tablet 3   No current facility-administered medications for this visit.    OBJECTIVE: young White woman appears well Filed Vitals:  08/14/15 1156  BP: 131/96  Pulse: 90  Temp: 98.9 F (37.2 C)  Resp: 18     Body mass index is 27.34 kg/(m^2).    ECOG FS:0 - Asymptomatic  Sclerae unicteric, pupils round and equal Oropharynx clear and moist-- no thrush or other lesions No cervical or supraclavicular adenopathy Lungs no rales or rhonchi Heart regular rate and rhythm Abd soft, nontender, positive bowel sounds MSK no focal spinal tenderness, no upper extremity lymphedema Neuro: nonfocal, well oriented, appropriate affect Breasts: The right breast is status post lumpectomy and radiation.  There is no evidence of local recurrence. The right axilla is benign. The left breast is unremarkable.    LAB RESULTS:  CMP     Component Value Date/Time   NA 139 02/06/2015 0759   NA 141 03/18/2011 1200   K 4.0 02/06/2015 0759   K 3.7 03/18/2011 1200   CL 107 03/18/2011 1200   CO2 21* 02/06/2015 0759   CO2 25 03/18/2011 1200   GLUCOSE 115 02/06/2015 0759   GLUCOSE 91 03/18/2011 1200   BUN 12.9 02/06/2015 0759   BUN 14 03/18/2011 1200   CREATININE 0.8 02/06/2015 0759   CREATININE 0.70 03/18/2011 1200   CALCIUM 9.1 02/06/2015 0759   CALCIUM 9.2 03/18/2011 1200   PROT 6.9 02/06/2015 0759   PROT 6.5 03/18/2011 1200   ALBUMIN 3.6 02/06/2015 0759   ALBUMIN 3.5 03/18/2011 1200   AST 19 02/06/2015 0759   AST 13 03/18/2011 1200   ALT 17 02/06/2015 0759   ALT 11 03/18/2011 1200   ALKPHOS 44 02/06/2015 0759   ALKPHOS 42 03/18/2011 1200   BILITOT 0.68 02/06/2015 0759   BILITOT 0.3 03/18/2011 1200   GFRNONAA >90 03/18/2011 1200   GFRAA >90 03/18/2011 1200    INo results found for: SPEP, UPEP  Lab Results  Component Value Date   WBC 4.0 02/06/2015   NEUTROABS 2.1 02/06/2015   HGB 13.6 02/06/2015   HCT 40.5 02/06/2015   MCV 98.1 02/06/2015   PLT 304 02/06/2015      Chemistry      Component Value Date/Time   NA 139 02/06/2015 0759   NA 141 03/18/2011 1200   K 4.0 02/06/2015 0759   K 3.7 03/18/2011 1200   CL 107 03/18/2011 1200   CO2 21* 02/06/2015 0759   CO2 25 03/18/2011 1200   BUN 12.9 02/06/2015 0759   BUN 14 03/18/2011 1200   CREATININE 0.8 02/06/2015 0759   CREATININE 0.70 03/18/2011 1200      Component Value Date/Time   CALCIUM 9.1 02/06/2015 0759   CALCIUM 9.2 03/18/2011 1200   ALKPHOS 44 02/06/2015 0759   ALKPHOS 42 03/18/2011 1200   AST 19 02/06/2015 0759   AST 13 03/18/2011 1200   ALT 17 02/06/2015 0759   ALT 11 03/18/2011 1200   BILITOT 0.68 02/06/2015 0759   BILITOT 0.3 03/18/2011 1200       No results found for: LABCA2  No components  found for: [id number]  No results for input(s): INR in the last 168 hours.  Urinalysis    Component Value Date/Time   COLORURINE YELLOW 03/18/2011 1322   APPEARANCEUR CLOUDY* 03/18/2011 1322   LABSPEC 1.026 03/18/2011 1322   PHURINE 6.0 03/18/2011 1322   GLUCOSEU NEGATIVE 03/18/2011 1322   HGBUR NEGATIVE 03/18/2011 1322   BILIRUBINUR SMALL* 03/18/2011 1322   KETONESUR >80* 03/18/2011 1322   PROTEINUR NEGATIVE 03/18/2011 1322   UROBILINOGEN 0.2 03/18/2011 1322   NITRITE NEGATIVE 03/18/2011 1322  LEUKOCYTESUR NEGATIVE 03/18/2011 1322    STUDIES: No results found.  ASSESSMENT: 45 y.o. BRCA negative [place] woman status post right breast upper outer quadrant biopsy 04/22/2014 for a clinical T1b N0, stage IA  Invasive ductal carcinoma, grade 1, strongly estrogen and progesterone receptor positive, HER-2 negative, with an MIB-1 of 12%   (1) genetics testing using the [employer] gene panel/ [facility] showed no deleterious mutations in ATM, BARD1, BRCA1, BRCA2, BRIP1, CDH1, CHEK2, EPCAM, MLH1, MRE11A, MSH2, MSH6, MUTYH, NBN, NF1, PALB2, PMS2, PTEN, RAD50, RAD51C, RAD51D, SMARCA4, STK11, or TP53.   (2) right partial mastectomy with sentinel lymph node sampling 05/17/2014 showed a pT1b pN0, stage IA invasive ductal carcinoma, grade 1, with negative margins.   (3) Oncotype DX score of 9 predicts a 10 year risk of outside the breast recurrence of 6% if the patient's only systemic treatment is tamoxifen for 5 years. It also predicts no benefit from chemotherapy.   (4) adjuvant radiation 06/21/2014 through 07/13/2014: Right breast 4250 cGy in 17 sessions   (5) tamoxifen started 08/29/2014  (6) left breast UOQ biopsy 10/28/2014 shows complex sclerosing lesion  (a) left lumpectomy 01/05/15 revealed fibrocystic changes with adenosis and calcifications. 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  PLAN: Brooke Ferguson is now a year out  from definitive surgery for her breast cancer, with no evidence of disease recurrence. This is very favorable.  She is tolerating the tamoxifen moderately well. The night sweats are the big problem. I think she would benefit from gabapentin. Today we talked about the possible toxicities, side effects and complications of that agent and I wrote a prescription for her to take 300 mg at bedtime. She will let me know if that does not work for her or if she has any problems from it.  She is due for her next mammogram in August. She is going to see me again in November. At that point we will consider switching her to once a year follow-up alternating with Dr. Ronita Hipps.  I did encourage her to start an exercise program. She did call regarding [employer], but it meets during the day and she has to work.I gave her information on the [facility] program.  She knows to call for any other problems that may develop before her next visit here.  Chauncey Cruel, MD   08/14/2015 12:35 PM20

## 2015-08-18 ENCOUNTER — Telehealth: Payer: Self-pay | Admitting: Oncology

## 2015-08-18 NOTE — Telephone Encounter (Signed)
sched appt per [facility] office note. No pof in epic. Pt aware of appt date/time

## 2015-08-19 LAB — ESTRADIOL, ULTRA SENS: ESTRADIOL, SENSITIVE: 579.9 pg/mL

## 2015-08-22 ENCOUNTER — Other Ambulatory Visit: Payer: Self-pay | Admitting: Oncology

## 2015-09-07 ENCOUNTER — Telehealth: Payer: Self-pay | Admitting: *Deleted

## 2015-09-07 NOTE — Telephone Encounter (Signed)
This RN spoke with Brooke Ferguson per her call inquiring " does tamoxifen make you feel like you are having a panic attack ?  [name] states she woke up at 5 am " with a hot flash but also feeling kinda panicky like I had a bad dream and was running from something ".  This RN started tamoxifen approximately 1 year ago- but hot flashes have been increasing lately. Above is first time of occurrence with panic that was more severe.  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   Per call Brooke Ferguson states she would like to resume use of gabapentin.   Per review of Brooke Ferguson's lab and noted low dose of gabapentin- and medical literature- use is dosed below allowable dosage.  Plan per call is Brooke Ferguson will resume gabapentin at ordered dose of 300 mg nightly. She will review " paced breathing " as well as avoidance of alcohol prior to going to bed.  Bithiah will call this office if symptoms continue.

## 2015-09-25 ENCOUNTER — Telehealth: Payer: Self-pay | Admitting: *Deleted

## 2015-09-25 MED ORDER — VENLAFAXINE HCL 37.5 MG PO TABS: 37.5000 mg | ORAL_TABLET | Freq: Every day | ORAL | 1 refills | Status: DC

## 2015-09-25 NOTE — Telephone Encounter (Signed)
This RN spoke with pt per concerns she has been having since starting the tamoxfen - and gabapentin.  She has noted " like wake up with panic attack that last for a while "  She states when she holds the gabapentin she does not have these symptoms.  Per discussion - she would like to stop the gabapentin and initiate " other medication that was discussed "  Plan per call is pt will initiate venlafaxine at low dose -if post 10 days she does see an improvement in hot flashes then she can increase the dose to 75 mg.  Brooke Ferguson will call this RN if further concerns.  Prescription obtained and sent to verified pharmacy.

## 2015-10-23 ENCOUNTER — Other Ambulatory Visit: Payer: Self-pay | Admitting: Oncology

## 2015-10-23 DIAGNOSIS — Z853 Personal history of malignant neoplasm of breast: Secondary | ICD-10-CM

## 2015-10-30 ENCOUNTER — Ambulatory Visit
Admission: RE | Admit: 2015-10-30 | Discharge: 2015-10-30 | Disposition: A | Discharge status: Home / Self Care | Payer: No Typology Code available for payment source | Source: Ambulatory Visit | Attending: Oncology | Admitting: Oncology

## 2015-10-30 DIAGNOSIS — Z853 Personal history of malignant neoplasm of breast: Secondary | ICD-10-CM

## 2016-01-12 ENCOUNTER — Other Ambulatory Visit: Payer: Self-pay | Admitting: *Deleted

## 2016-01-12 DIAGNOSIS — C50411 Malignant neoplasm of upper-outer quadrant of right female breast: Secondary | ICD-10-CM

## 2016-01-15 ENCOUNTER — Ambulatory Visit (HOSPITAL_BASED_OUTPATIENT_CLINIC_OR_DEPARTMENT_OTHER): Payer: No Typology Code available for payment source | Admitting: Oncology

## 2016-01-15 ENCOUNTER — Other Ambulatory Visit (HOSPITAL_BASED_OUTPATIENT_CLINIC_OR_DEPARTMENT_OTHER): Payer: No Typology Code available for payment source

## 2016-01-15 VITALS — BP 129/81 | HR 77 | Temp 98.8°F | Resp 18 | Ht 66.0 in | Wt 170.9 lb

## 2016-01-15 DIAGNOSIS — C50411 Malignant neoplasm of upper-outer quadrant of right female breast: Secondary | ICD-10-CM | POA: Diagnosis not present

## 2016-01-15 DIAGNOSIS — Z170 Estrogen receptor positive status [ER+]: Secondary | ICD-10-CM | POA: Diagnosis not present

## 2016-01-15 LAB — CBC WITH DIFFERENTIAL/PLATELET
BASO%: 0.2 % (ref 0.0–2.0)
Basophils Absolute: 0 10*3/uL (ref 0.0–0.1)
EOS ABS: 0.1 10*3/uL (ref 0.0–0.5)
EOS%: 1.5 % (ref 0.0–7.0)
HCT: 37.4 % (ref 34.8–46.6)
HEMOGLOBIN: 12.9 g/dL (ref 11.6–15.9)
LYMPH#: 1.8 10*3/uL (ref 0.9–3.3)
LYMPH%: 30.3 % (ref 14.0–49.7)
MCH: 33.5 pg (ref 25.1–34.0)
MCHC: 34.5 g/dL (ref 31.5–36.0)
MCV: 97.1 fL (ref 79.5–101.0)
MONO#: 0.4 10*3/uL (ref 0.1–0.9)
MONO%: 6.4 % (ref 0.0–14.0)
NEUT%: 61.6 % (ref 38.4–76.8)
NEUTROS ABS: 3.6 10*3/uL (ref 1.5–6.5)
Platelets: 272 10*3/uL (ref 145–400)
RBC: 3.85 10*6/uL (ref 3.70–5.45)
RDW: 12.9 % (ref 11.2–14.5)
WBC: 5.8 10*3/uL (ref 3.9–10.3)

## 2016-01-15 LAB — COMPREHENSIVE METABOLIC PANEL
ALBUMIN: 3.2 g/dL — AB (ref 3.5–5.0)
ALK PHOS: 55 U/L (ref 40–150)
ALT: 20 U/L (ref 0–55)
AST: 22 U/L (ref 5–34)
Anion Gap: 10 mEq/L (ref 3–11)
BILIRUBIN TOTAL: 0.42 mg/dL (ref 0.20–1.20)
BUN: 16.4 mg/dL (ref 7.0–26.0)
CO2: 20 mEq/L — ABNORMAL LOW (ref 22–29)
Calcium: 9.4 mg/dL (ref 8.4–10.4)
Chloride: 109 mEq/L (ref 98–109)
Creatinine: 0.7 mg/dL (ref 0.6–1.1)
EGFR: >90 mL/min/{1.73_m2} (ref >90)
GLUCOSE: 109 mg/dL (ref 70–140)
Potassium: 3.7 mEq/L (ref 3.5–5.1)
SODIUM: 139 meq/L (ref 136–145)
TOTAL PROTEIN: 6.7 g/dL (ref 6.4–8.3)

## 2016-01-15 NOTE — Progress Notes (Signed)
[place]  [phone]) [phone] [phone]     ID: Brooke Ferguson DOB: Oct 31, 1970  MR#: [id number]  NOM#:[id number]  Patient Care Team: Brien Few, MD as PCP - General (Obstetrics and Gynecology) Erroll Luna, MD as Consulting Physician (General Surgery) Chauncey Cruel, MD as Consulting Physician (Oncology) Arloa Koh, MD as Consulting Physician (Radiation Oncology) Rockwell Germany, RN as Registered Nurse Mauro Kaufmann, RN as Registered Nurse Holley Bouche, NP as Nurse Practitioner (Nurse Practitioner) Brien Few, MD as Consulting Physician (Obstetrics and Gynecology) Sylvan Cheese, NP as Nurse Practitioner (Nurse Practitioner) OTHER MD: Jamal Maes MD  CHIEF COMPLAINT: early-stage estrogen receptor positive breast cancer  CURRENT TREATMENT: tamoxifen   BREAST CANCER HISTORY: From the original intake note:  Brooke Ferguson has received yearly mammography since age 61. She has breast density category C. On 09/30/2013 bilateral screening mammography showed a possible asymmetry in the right breast. Right diagnostic mammography with tomosynthesis and right breast ultrasonography was performed 10/08/2013. On spot compression views there appears to be a persistent mixed attenuation mass in the right breast suggesting an intramammary lymph node. Ultrasound showed several small cysts not related to this finding. There was a diagnosed in her graphic abnormality noted associated with a possible abnormality.   Accordingly 6 month follow-up was suggested and performed 04/12/2014. On this date right diagnostic mammography and ultrasonography showed an oval 5 mm mass with indistinct med margins in the upper right breast. This was not palpable. Targeted ultrasound now found a hypoechoic mass measuring 5 mm in the area in question, and this was biopsied 04/22/2014. The pathology showed (SAA [id number]) and invasive ductal carcinoma, grade 1, estrogen  receptor 100% positive, progesterone receptor 97% positive, both with strong staining intensity, with an MIB-1 of 12%, and no HER-2 amplification, the signals ratio being 1.21 and the number per cell 2.00.  On 05/02/2014 the patient underwent bilateral breast MRI. This found the breast density to be category B. In the upper outer quadrant of the right breast there was a 6 mm enhancing mass with slightly irregular margins. There were no abnormal lymph nodes and the left breast was unremarkable. In the upper abdomen and left upper quadrant there were innumerable cysts, some measuring up to 6 cm in size.  The patient's subsequent history is as detailed below.  INTERVAL HISTORY: Brooke Ferguson returns today for follow-up of her right-sided  breast cancer.. She tolerates tamoxifen generally well, although she still has night sweats. She tried venlafaxine but it gave her anxiety attacks instead of helping her sleep. We did prescribe venlafaxine but she never started because she was concerned after reading the possible list of side effects.   REVIEW OF SYSTEMS: Brooke Ferguson is not exercising regularly. She dropped her gym membership since she was not going. She did explore the possibility of the [place] program, but there is no evening availability and of course she works from 7 AM to about 4 PM every day. From this issueAside  detailed review of systems was otherwise stable   PAST MEDICAL HISTORY: Past Medical History:  Diagnosis Date  . Arthritis    bulging disc in upper neck C6C7  . Breast cancer of upper-outer quadrant of right female breast ([place]) 04/26/2014  . Bulging disc    BETWEEN C6-7, HAS HAD 2 CORTISONE INJECTIONS  . History of lumpectomy of right breast 05/17/14   With sentinel node biopsy  . S/P radiation therapy 06/21/2014 through 07/13/2014    Right breast 4250 cGy in 17 sessions  PAST SURGICAL HISTORY: Past  Surgical History:  Procedure Laterality Date  . BREAST LUMPECTOMY WITH RADIOACTIVE SEED LOCALIZATION Left 01/05/2015   Procedure: BREAST LUMPECTOMY WITH RADIOACTIVE SEED LOCALIZATION;  Surgeon: Erroll Luna, MD;  Location: [facility];  Service: General;  Laterality: Left;  . DIAGNOSTIC LAPAROSCOPY     OVARIAN CYCTS AT AGE 82 YRS  . DILATION AND CURETTAGE OF UTERUS    . HYSTEROSCOPY WITH RESECTOSCOPE  10/25/2010   Procedure: HYSTEROSCOPY WITH RESECTOSCOPE;  Surgeon: Lovenia Kim, MD;  Location: [facility];  Service: Gynecology;  Laterality: N/A;  Polypectomy  . WISDOM TOOTH EXTRACTION      FAMILY HISTORY Family History  Problem Relation Age of Onset  . Cancer Mother     astrocytoma; deceased 99  . Breast cancer Maternal Grandmother     Great-Great Grandmotherlung; smoker; currently 2  . Cancer Other     colon; mat grandmother's sister  . Cancer Other     esophagus; mat grandmother's sister   The patient's father is alive, age 28 as of 2014-05-31. The patient's mother died at age 67 from an astrocytoma which was diagnosed a year earlier. The patient has no brothers, one sister. There is no history of breast cancer in the the immediate family. On the maternal side however the patient's mother's grandmother lived to be 76 but had been diagnosed with breast cancer at some point. There are also cases of lung cancer, esophageal cancer, and colon cancer on the mother's side of the family. There are no other breast or ovarian cancers to the patient's knowledge  GYNECOLOGIC HISTORY:  No LMP recorded. Menarche age 52. The patient is GX P0. She still having regular periods. She used oral contraceptives between the ages of 46 and 17. She underwent hysteroscopy with "scraping" for uterine polyps in 05/31/2010. She also is status post partial oophorectomy on the right because of an ovarian cyst.--(At the 08/29/2014 visited the patient explained she has found further information regarding her  family. There is a cousin who developed breast cancer in her 54s. There is another cousin, but she does not know the age, undergoing dialysis. She does not know the reason for end-stage renal disease on that person  SOCIAL HISTORY: (updated June 2017) She'll works as a Adult nurse. Gerald Stabs works for a [employer] account pl[employer] (they do a lot of the things testing for defibrillators.). A stepson, Zack, 60, lives with them. He is currently a Ship broker at [employer] and has a job in [employer]. The patient is not a church attender    ADVANCED DIRECTIVES: not in place   HEALTH MAINTENANCE: Social History  Substance Use Topics  . Smoking status: Never Smoker  . Smokeless tobacco: Never Used  . Alcohol use Yes     Comment: SOCIALLY     Colonoscopy:never  XYI:AXKPVV May 30, 2013  Bone density:never  Lipid panel:  Allergies  Allergen Reactions  . Vicodin [Hydrocodone-Acetaminophen] Nausea And Vomiting    Current Outpatient Prescriptions  Medication Sig Dispense Refill  . betamethasone dipropionate (DIPROLENE) 0.05 % cream Apply topically daily.    Marland Kitchen ibuprofen (ADVIL,MOTRIN) 800 MG tablet   0  . tamoxifen (NOLVADEX) 20 MG tablet Take 1 tablet (20 mg total) by mouth daily. 90 tablet 3  . venlafaxine (EFFEXOR) 37.5 MG tablet Take 1 tablet (37.5 mg total) by mouth at bedtime. 90 tablet 1   No current facility-administered medications for this visit.     OBJECTIVE: young White woman In no acute distress  Vitals:   01/15/16 1454  BP: 129/81  Pulse: 77  Resp: 18  Temp: 98.8 F (37.1 C)     Body mass index is 27.58 kg/m.    ECOG FS:0 - Asymptomatic  Sclerae unicteric, EOMs intact Oropharynx clear and moist No cervical or supraclavicular adenopathy Lungs no rales or rhonchi Heart regular rate and rhythm Abd soft, nontender, positive bowel sounds MSK no focal spinal tenderness, no upper extremity lymphedema Neuro: nonfocal, well oriented, appropriate affect Breasts: Right breast is status  post lumpectomy and radiation. There is no evidence of local recurrence. The right axilla is benign. The left breast is unremarkable   LAB RESULTS:  CMP     Component Value Date/Time   NA 139 01/15/2016 1357   K 3.7 01/15/2016 1357   CL 107 03/18/2011 1200   CO2 20 (L) 01/15/2016 1357   GLUCOSE 109 01/15/2016 1357   BUN 16.4 01/15/2016 1357   CREATININE 0.7 01/15/2016 1357   CALCIUM 9.4 01/15/2016 1357   PROT 6.7 01/15/2016 1357   ALBUMIN 3.2 (L) 01/15/2016 1357   AST 22 01/15/2016 1357   ALT 20 01/15/2016 1357   ALKPHOS 55 01/15/2016 1357   BILITOT 0.42 01/15/2016 1357   GFRNONAA >90 03/18/2011 1200   GFRAA >90 03/18/2011 1200    INo results found for: SPEP, UPEP  Lab Results  Component Value Date   WBC 5.8 01/15/2016   NEUTROABS 3.6 01/15/2016   HGB 12.9 01/15/2016   HCT 37.4 01/15/2016   MCV 97.1 01/15/2016   PLT 272 01/15/2016      Chemistry      Component Value Date/Time   NA 139 01/15/2016 1357   K 3.7 01/15/2016 1357   CL 107 03/18/2011 1200   CO2 20 (L) 01/15/2016 1357   BUN 16.4 01/15/2016 1357   CREATININE 0.7 01/15/2016 1357      Component Value Date/Time   CALCIUM 9.4 01/15/2016 1357   ALKPHOS 55 01/15/2016 1357   AST 22 01/15/2016 1357   ALT 20 01/15/2016 1357   BILITOT 0.42 01/15/2016 1357       No results found for: LABCA2  No components found for: LABCA125  No results for input(s): INR in the last 168 hours.  Urinalysis    Component Value Date/Time   COLORURINE YELLOW 03/18/2011 1322   APPEARANCEUR CLOUDY (A) 03/18/2011 1322   LABSPEC 1.026 03/18/2011 1322   PHURINE 6.0 03/18/2011 1322   GLUCOSEU NEGATIVE 03/18/2011 1322   HGBUR NEGATIVE 03/18/2011 1322   BILIRUBINUR SMALL (A) 03/18/2011 1322   KETONESUR >80 (A) 03/18/2011 1322   PROTEINUR NEGATIVE 03/18/2011 1322   UROBILINOGEN 0.2 03/18/2011 1322   NITRITE NEGATIVE 03/18/2011 1322   LEUKOCYTESUR NEGATIVE 03/18/2011 1322    STUDIES: Study Result   CLINICAL DATA:   Annual examination. History of right breast cancer, diagnosed in 2016. The patient is status post lumpectomy in March 2016, followed by radiation therapy. Subsequently, she had excisional biopsy of a complex sclerosing lesion in the outer left breast on 01/02/2015. She is asymptomatic.  EXAM: 2D DIGITAL DIAGNOSTIC BILATERAL MAMMOGRAM WITH CAD AND ADJUNCT TOMO  COMPARISON:  Previous exam(s).  ACR Breast Density Category b: There are scattered areas of fibroglandular density.  FINDINGS: There are postsurgical changes in the outer breast bilaterally. There is slight skin thickening of the right breast consistent with prior radiation therapy. No suspicious mass, nonsurgical distortion, or suspicious microcalcification is identified in either breast to suggest malignancy.  Mammographic images were processed with CAD.  IMPRESSION: No evidence of malignancy in either breast. Lumpectomy and radiation changes in the right breast and excisional biopsy changes in the left breast.  RECOMMENDATION: Diagnostic mammogram is suggested in 1 year. (Code:[id number])  I have discussed the findings and recommendations with the patient. Results were also provided in writing at the conclusion of the visit. If applicable, a reminder letter will be sent to the patient regarding the next appointment.  BI-RADS CATEGORY  2: Benign.   Electronically Signed   By: [name] M.D.   On: 10/30/2015 16:35    ASSESSMENT: 45 y.o. BRCA negative [place] woman status post right breast upper outer quadrant biopsy 04/22/2014 for a clinical T1b N0, stage IA  Invasive ductal carcinoma, grade 1, strongly estrogen and progesterone receptor positive, HER-2 negative, with an MIB-1 of 12%   (1) genetics testing using the [employer] gene panel/ [facility] showed no deleterious mutations in ATM, BARD1, BRCA1, BRCA2, BRIP1, CDH1, CHEK2, EPCAM, MLH1, MRE11A, MSH2, MSH6, MUTYH, NBN, NF1, PALB2, PMS2, PTEN,  RAD50, RAD51C, RAD51D, SMARCA4, STK11, or TP53.   (2) right partial mastectomy with sentinel lymph node sampling 05/17/2014 showed a pT1b pN0, stage IA invasive ductal carcinoma, grade 1, with negative margins.   (3) Oncotype DX score of 9 predicts a 10 year risk of outside the breast recurrence of 6% if the patient's only systemic treatment is tamoxifen for 5 years. It also predicts no benefit from chemotherapy.   (4) adjuvant radiation 06/21/2014 through 07/13/2014: Right breast 4250 cGy in 17 sessions   (5) tamoxifen started 08/29/2014  (6) left breast UOQ biopsy 10/28/2014 shows complex sclerosing lesion  (a) left lumpectomy 01/05/15 revealed fibrocystic changes with adenosis and calcifications. 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  PLAN: Brooke Ferguson is now 1-1/2 years out from definitive surgery for her early-stage breast cancer with no evidence of disease recurrence. This is very favorable.  She is tolerating tamoxifen moderately well. The problem continues to be the hot flashes and night sweats.  I suggested that since she already purchased the venlafaxine, she should give it a try. She would be taking one fourth of the dose that is used for depression. If she doesn't find any benefit but also no side effects after 2 weeks, she can increase the dose to 2 tablets daily and see if she tolerates that. I suspect it would make a significant difference to her sense of well-being although she already has an excellent functional status  We also discussed exercise. She understands how important this is for her and the many possible benefits of a regular exercise program.  She will see me again in 6 months. From that point I will start seeing her on a once a year basis.  Chauncey Cruel, MD   01/15/2016 6:38 PM20

## 2016-06-21 ENCOUNTER — Other Ambulatory Visit: Payer: Self-pay | Admitting: Oncology

## 2016-07-12 ENCOUNTER — Other Ambulatory Visit: Payer: Self-pay | Admitting: Adult Health

## 2016-07-12 DIAGNOSIS — Z170 Estrogen receptor positive status [ER+]: Principal | ICD-10-CM

## 2016-07-12 DIAGNOSIS — C50411 Malignant neoplasm of upper-outer quadrant of right female breast: Secondary | ICD-10-CM

## 2016-07-15 ENCOUNTER — Other Ambulatory Visit (HOSPITAL_BASED_OUTPATIENT_CLINIC_OR_DEPARTMENT_OTHER): Payer: No Typology Code available for payment source

## 2016-07-15 DIAGNOSIS — C50411 Malignant neoplasm of upper-outer quadrant of right female breast: Secondary | ICD-10-CM

## 2016-07-15 DIAGNOSIS — Z170 Estrogen receptor positive status [ER+]: Principal | ICD-10-CM

## 2016-07-15 LAB — CBC WITH DIFFERENTIAL/PLATELET
BASO%: 0.2 % (ref 0.0–2.0)
Basophils Absolute: 0 10*3/uL (ref 0.0–0.1)
EOS%: 2.4 % (ref 0.0–7.0)
Eosinophils Absolute: 0.2 10*3/uL (ref 0.0–0.5)
HCT: 38.2 % (ref 34.8–46.6)
HEMOGLOBIN: 12.9 g/dL (ref 11.6–15.9)
LYMPH#: 2.2 10*3/uL (ref 0.9–3.3)
LYMPH%: 34.5 % (ref 14.0–49.7)
MCH: 33 pg (ref 25.1–34.0)
MCHC: 33.8 g/dL (ref 31.5–36.0)
MCV: 97.7 fL (ref 79.5–101.0)
MONO#: 0.5 10*3/uL (ref 0.1–0.9)
MONO%: 8.5 % (ref 0.0–14.0)
NEUT#: 3.4 10*3/uL (ref 1.5–6.5)
NEUT%: 54.4 % (ref 38.4–76.8)
Platelets: 238 10*3/uL (ref 145–400)
RBC: 3.91 10*6/uL (ref 3.70–5.45)
RDW: 12.4 % (ref 11.2–14.5)
WBC: 6.3 10*3/uL (ref 3.9–10.3)

## 2016-07-15 LAB — COMPREHENSIVE METABOLIC PANEL
ALBUMIN: 3.6 g/dL (ref 3.5–5.0)
ALK PHOS: 42 U/L (ref 40–150)
ALT: 23 U/L (ref 0–55)
AST: 23 U/L (ref 5–34)
Anion Gap: 9 mEq/L (ref 3–11)
BUN: 14.2 mg/dL (ref 7.0–26.0)
CHLORIDE: 109 meq/L (ref 98–109)
CO2: 23 mEq/L (ref 22–29)
Calcium: 9.5 mg/dL (ref 8.4–10.4)
Creatinine: 0.7 mg/dL (ref 0.6–1.1)
EGFR: >90 mL/min/{1.73_m2} (ref >90)
GLUCOSE: 102 mg/dL (ref 70–140)
POTASSIUM: 4.6 meq/L (ref 3.5–5.1)
SODIUM: 141 meq/L (ref 136–145)
Total Bilirubin: 0.52 mg/dL (ref 0.20–1.20)
Total Protein: 6.5 g/dL (ref 6.4–8.3)

## 2016-07-17 ENCOUNTER — Other Ambulatory Visit: Payer: No Typology Code available for payment source

## 2016-07-17 ENCOUNTER — Ambulatory Visit (HOSPITAL_BASED_OUTPATIENT_CLINIC_OR_DEPARTMENT_OTHER): Payer: No Typology Code available for payment source | Admitting: Oncology

## 2016-07-17 VITALS — BP 128/84 | HR 71 | Temp 99.0°F | Resp 20 | Ht 66.0 in | Wt 166.5 lb

## 2016-07-17 DIAGNOSIS — Z170 Estrogen receptor positive status [ER+]: Secondary | ICD-10-CM

## 2016-07-17 DIAGNOSIS — C50411 Malignant neoplasm of upper-outer quadrant of right female breast: Secondary | ICD-10-CM

## 2016-07-17 MED ORDER — TAMOXIFEN CITRATE 20 MG PO TABS: 20.0000 mg | ORAL_TABLET | Freq: Every day | ORAL | 3 refills | Status: DC

## 2016-07-17 NOTE — Progress Notes (Signed)
Brooke Ferguson  [phone] [phone]     ID: Brooke Ferguson DOB: 1970-09-23  MR#: [id number]  JJK#:[id number]  Patient Care Team: Brien Few, MD as PCP - General (Obstetrics and Gynecology) Erroll Luna, MD as Consulting Physician (General Surgery) Magrinat, Virgie Dad, MD as Consulting Physician (Oncology) Arloa Koh, MD as Consulting Physician (Radiation Oncology) Rockwell Germany, RN as Registered Nurse Mauro Kaufmann, RN as Registered Nurse Holley Bouche, NP as Nurse Practitioner (Nurse Practitioner) Brien Few, MD as Consulting Physician (Obstetrics and Gynecology) Sylvan Cheese, NP as Nurse Practitioner (Nurse Practitioner) OTHER MD: Jamal Maes MD  CHIEF COMPLAINT: early-stage estrogen receptor positive breast cancer  CURRENT TREATMENT: tamoxifen   BREAST CANCER HISTORY: From the original intake note:  Brooke Ferguson has received yearly mammography since age 22. She has breast density category C. On 09/30/2013 bilateral screening mammography showed a possible asymmetry in the right breast. Right diagnostic mammography with tomosynthesis and right breast ultrasonography was performed 10/08/2013. On spot compression views there appears to be a persistent mixed attenuation mass in the right breast suggesting an intramammary lymph node. Ultrasound showed several small cysts not related to this finding. There was a diagnosed in her graphic abnormality noted associated with a possible abnormality.   Accordingly 6 month follow-up was suggested and performed 04/12/2014. On this date right diagnostic mammography and ultrasonography showed an oval 5 mm mass with indistinct med margins in the upper right breast. This was not palpable. Targeted ultrasound now found a hypoechoic mass measuring 5 mm in the area in question, and this was biopsied 04/22/2014. The pathology showed ([facility] [id number]) and invasive ductal carcinoma, grade 1,  estrogen receptor 100% positive, progesterone receptor 97% positive, both with strong staining intensity, with an MIB-1 of 12%, and no HER-2 amplification, the signals ratio being 1.21 and the number per cell 2.00.  On 05/02/2014 the patient underwent bilateral breast MRI. This found the breast density to be category B. In the upper outer quadrant of the right breast there was a 6 mm enhancing mass with slightly irregular margins. There were no abnormal lymph nodes and the left breast was unremarkable. In the upper abdomen and left upper quadrant there were innumerable cysts, some measuring up to 6 cm in size.  The patient's subsequent history is as detailed below.  INTERVAL HISTORY: Brooke Ferguson returns today for follow-up of her estrogen receptor positive breast cancer. She continues on tamoxifen, with good tolerance. Hot flashes are not a problem. In fact they are much less since she started exercising regularly. She does not vaginal wetness issues. She obtains the drug at a good price.  We had prescribed gabapentin and venlafaxine for hot flashes. She never did start the venlafaxine and she has stopped the gabapentin. It doesn't seem to have made any difference to her symptoms. She is now essentially off all medications except for tamoxifen.  The really great news is that she is going to the gym about 3 times a week. Her husband Brooke Ferguson is going with her. They do a pretty thorough workout. In addition she is on a terrific diet through [employer]. She has lost 11 pounds in 6 weeks. She feels terrific  REVIEW OF SYSTEMS: A detailed review of systems today was otherwise stable  PAST MEDICAL HISTORY: Past Medical History:  Diagnosis Date  . Arthritis    bulging disc in upper neck C6C7  . Breast cancer of upper-outer quadrant of right female breast ([place]) 04/26/2014  . Bulging disc  BETWEEN C6-7, HAS HAD 2 CORTISONE INJECTIONS  . History of lumpectomy of right breast 05/17/14   With sentinel  node biopsy  . S/P radiation therapy 06/21/2014 through 07/13/2014    Right breast 4250 cGy in 17 sessions     PAST SURGICAL HISTORY: Past Surgical History:  Procedure Laterality Date  . BREAST LUMPECTOMY WITH RADIOACTIVE SEED LOCALIZATION Left 01/05/2015   Procedure: BREAST LUMPECTOMY WITH RADIOACTIVE SEED LOCALIZATION;  Surgeon: Erroll Luna, MD;  Location: [facility];  Service: General;  Laterality: Left;  . DIAGNOSTIC LAPAROSCOPY     OVARIAN CYCTS AT AGE 63 YRS  . DILATION AND CURETTAGE OF UTERUS    . HYSTEROSCOPY WITH RESECTOSCOPE  10/25/2010   Procedure: HYSTEROSCOPY WITH RESECTOSCOPE;  Surgeon: Lovenia Kim, MD;  Location: [facility];  Service: Gynecology;  Laterality: N/A;  Polypectomy  . WISDOM TOOTH EXTRACTION      FAMILY HISTORY Family History  Problem Relation Age of Onset  . Cancer Mother        astrocytoma; deceased 71  . Breast cancer Maternal Grandmother        Great-Great Grandmotherlung; smoker; currently 64  . Cancer Other        colon; mat grandmother's sister  . Cancer Other        esophagus; mat grandmother's sister   The patient's father is alive, age 2 as of 31-May-2014. The patient's mother died at age 75 from an astrocytoma which was diagnosed a year earlier. The patient has no brothers, one sister. There is no history of breast cancer in the the immediate family. On the maternal side however the patient's mother's grandmother lived to be 64 but had been diagnosed with breast cancer at some point. There are also cases of lung cancer, esophageal cancer, and colon cancer on the mother's side of the family. There are no other breast or ovarian cancers to the patient's knowledge---At the 08/29/2014 visited the patient explained she has found further information regarding her family. There is a cousin who developed breast cancer in her 66s. There is another cousin, but she does  not know the age, undergoing dialysis. She does not know the reason for end-stage renal disease on that person   GYNECOLOGIC HISTORY:  No LMP recorded. Menarche age 48. The patient is GX P0. She still having regular periods. She used oral contraceptives between the ages of 38 and 55. She underwent hysteroscopy with "scraping" for uterine polyps in 05/31/10. 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 A stepson, Brooke Ferguson, 63, lives with them. He is currently a Ship broker at [employer] and has a job in [employer]. The patient is not a church attender    ADVANCED DIRECTIVES: not in place   HEALTH MAINTENANCE: Social History  Substance Use Topics  . Smoking status: Never Smoker  . Smokeless tobacco: Never Used  . Alcohol use Yes     Comment: SOCIALLY     Colonoscopy:never  YHC:WCBJSE May 30, 2013  Bone density:never  Lipid panel:  Allergies  Allergen Reactions  . Vicodin [Hydrocodone-Acetaminophen] Nausea And Vomiting    Current Outpatient Prescriptions  Medication Sig Dispense Refill  . betamethasone dipropionate (DIPROLENE) 0.05 % cream Apply topically daily.    Marland Kitchen ibuprofen (ADVIL,MOTRIN) 800 MG tablet   0  . tamoxifen (NOLVADEX)  20 MG tablet Take 1 tablet (20 mg total) by mouth daily. 90 tablet 3  . tamoxifen (NOLVADEX) 20 MG tablet TAKE 1 TABLET BY MOUTH  DAILY 90 tablet 3  . venlafaxine (EFFEXOR) 37.5 MG tablet Take 1 tablet (37.5 mg total) by mouth at bedtime. 90 tablet 1   No current facility-administered medications for this visit.     OBJECTIVE: young White womanWho appears well  Vitals:   07/17/16 1420  BP: 128/84  Pulse: 71  Resp: 20  Temp: 99 F (37.2 C)     Body mass index is 26.87 kg/m.    ECOG FS:0 - Asymptomatic  Sclerae unicteric, pupils round and  equal Oropharynx clear and moist No cervical or supraclavicular adenopathy Lungs no rales or rhonchi Heart regular rate and rhythm Abd soft, nontender, positive bowel sounds MSK no focal spinal tenderness, no upper extremity lymphedema Neuro: nonfocal, well oriented, appropriate affect Breasts: The right breast as undergone lumpectomy followed by radiation, with no evidence of local recurrence. The left breast is benign. Both axillae are benign.  LAB RESULTS:  CMP     Component Value Date/Time   NA 141 07/15/2016 1617   K 4.6 07/15/2016 1617   CL 107 03/18/2011 1200   CO2 23 07/15/2016 1617   GLUCOSE 102 07/15/2016 1617   BUN 14.2 07/15/2016 1617   CREATININE 0.7 07/15/2016 1617   CALCIUM 9.5 07/15/2016 1617   PROT 6.5 07/15/2016 1617   ALBUMIN 3.6 07/15/2016 1617   AST 23 07/15/2016 1617   ALT 23 07/15/2016 1617   ALKPHOS 42 07/15/2016 1617   BILITOT 0.52 07/15/2016 1617   GFRNONAA >90 03/18/2011 1200   GFRAA >90 03/18/2011 1200    INo results found for: SPEP, UPEP  Lab Results  Component Value Date   WBC 6.3 07/15/2016   NEUTROABS 3.4 07/15/2016   HGB 12.9 07/15/2016   HCT 38.2 07/15/2016   MCV 97.7 07/15/2016   PLT 238 07/15/2016      Chemistry      Component Value Date/Time   NA 141 07/15/2016 1617   K 4.6 07/15/2016 1617   CL 107 03/18/2011 1200   CO2 23 07/15/2016 1617   BUN 14.2 07/15/2016 1617   CREATININE 0.7 07/15/2016 1617      Component Value Date/Time   CALCIUM 9.5 07/15/2016 1617   ALKPHOS 42 07/15/2016 1617   AST 23 07/15/2016 1617   ALT 23 07/15/2016 1617   BILITOT 0.52 07/15/2016 1617       No results found for: LABCA2  No components found for: LABCA125  No results for input(s): INR in the last 168 hours.  Urinalysis    Component Value Date/Time   COLORURINE YELLOW 03/18/2011 1322   APPEARANCEUR CLOUDY (A) 03/18/2011 1322   LABSPEC 1.026 03/18/2011 1322   PHURINE 6.0 03/18/2011 1322   GLUCOSEU NEGATIVE 03/18/2011 1322    HGBUR NEGATIVE 03/18/2011 1322   BILIRUBINUR SMALL (A) 03/18/2011 1322   KETONESUR >80 (A) 03/18/2011 1322   PROTEINUR NEGATIVE 03/18/2011 1322   UROBILINOGEN 0.2 03/18/2011 1322   NITRITE NEGATIVE 03/18/2011 1322   LEUKOCYTESUR NEGATIVE 03/18/2011 1322    STUDIES: Mammography August 2017 found the breast density to be category B. There was no evidence of malignancy.  ASSESSMENT: 47 y.o. BRCA negative [place] woman status post right breast upper outer quadrant biopsy 04/22/2014 for a clinical T1b N0, stage IA  Invasive ductal carcinoma, grade 1, strongly estrogen and progesterone receptor positive, HER-2 negative, with an MIB-1 of  12%   (1) genetics testing using the [employer]va[employer] gene panel/ [facility] showed no deleterious mutations in ATM, BARD1, BRCA1, BRCA2, BRIP1, CDH1, CHEK2, EPCAM, MLH1, MRE11A, MSH2, MSH6, MUTYH, NBN, NF1, PALB2, PMS2, PTEN, RAD50, RAD51C, RAD51D, SMARCA4, STK11, or TP53.   (2) right partial mastectomy with sentinel lymph node sampling 05/17/2014 showed a pT1b pN0, stage IA invasive ductal carcinoma, grade 1, with negative margins.   (3) Oncotype DX score of 9 predicts a 10 year risk of outside the breast recurrence of 6% if the patient's only systemic treatment is tamoxifen for 5 years. It also predicts no benefit from chemotherapy.   (4) adjuvant radiation 06/21/2014 through 07/13/2014: Right breast 4250 cGy in 17 sessions   (5) tamoxifen started 08/29/2014  (6) left breast UOQ biopsy 10/28/2014 shows complex sclerosing lesion  (a) left lumpectomy 01/05/15 revealed fibrocystic changes with adenosis and calcifications. 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 This is very favorable.  She continues on tamoxifen, with good tolerance. The plan is to continue that for 5  years.  And delighted that she is going to the gym. She is also doing a terrific job on her diet. If she continues on this path she can achieve whatever weight goal she wants but the main point is that she looks very healthy and feels very healthy and that the goal.  She understands that pregnancy after endometrial ablation is rare but not unheard of. It might be prudent for them to use contrast section until she is fully menopausal.  At this point I feel comfortable seeing her on a yearly basis. She is scheduled for mammography in August. She will be seeing Dr. Ronita Hipps shortly thereafter  She knows to call for any problems that may develop before the next visit here. Chauncey Cruel, MD   07/17/2016 2:44 PM20

## 2016-09-11 IMAGING — MR MR ABDOMEN WO/W CM
9 of 18 series · 20 of 48 positions shown · IV contrast (multihance)
Comparison: Breast MR of 05/02/2014.

CLINICAL DATA: History of breast cancer with light right
lumpectomy. Diagnosed [DATE]. Breast MRI demonstrating hepatic cysts.

EXAM:
MRI ABDOMEN WITHOUT AND WITH CONTRAST
TECHNIQUE: Multiplanar multisequence MR imaging of the abdomen was performed
both before and after the administration of intravenous contrast.
CONTRAST:  17mL MULTIHANCE GADOBENATE DIMEGLUMINE 529 MG/ML IV SOLN

[Series 3: T2 fat-sat · axial · 5.0mm · 0.78mm/px · z∈[-199,+116]mm · 3 of 64 slices shown]
[im 1/64]
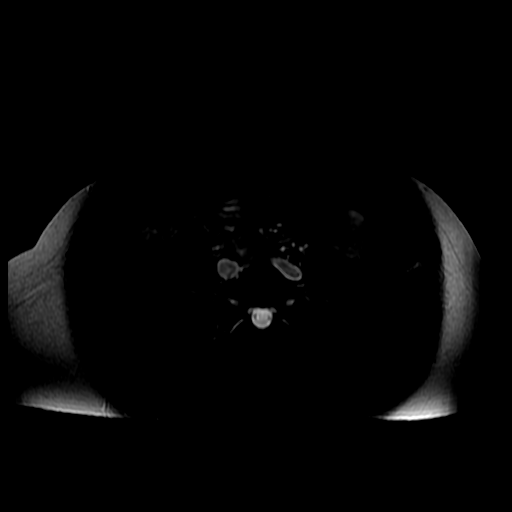
[im 32/64]
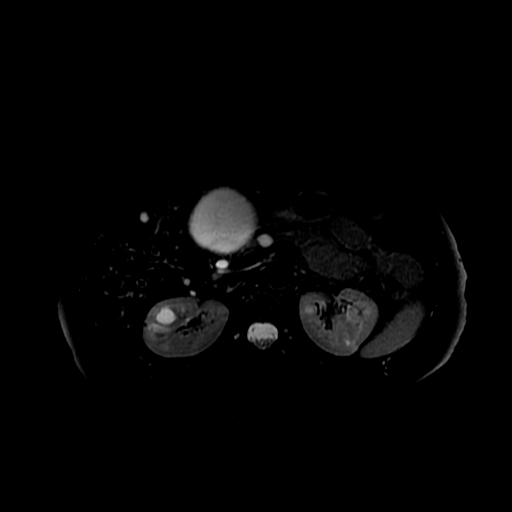
[im 64/64]
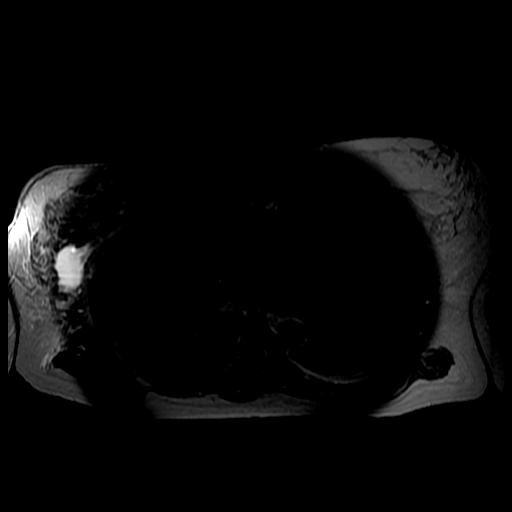

[Series 4: DWI b500 · axial · 6.0mm · 1.48mm/px · z∈[-194,+126]mm · 2 of 84 slices shown]
[im 1/84]
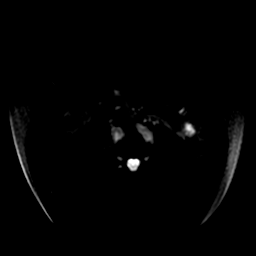
[im 84/84]
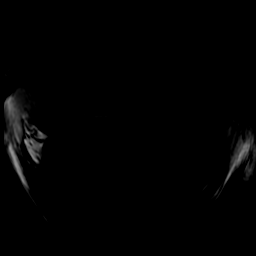

[Series 6: ax dualecho · axial · 5.0mm · 0.78mm/px · z∈[-196,+119]mm · 4 of 128 slices shown]
[im 1/128]
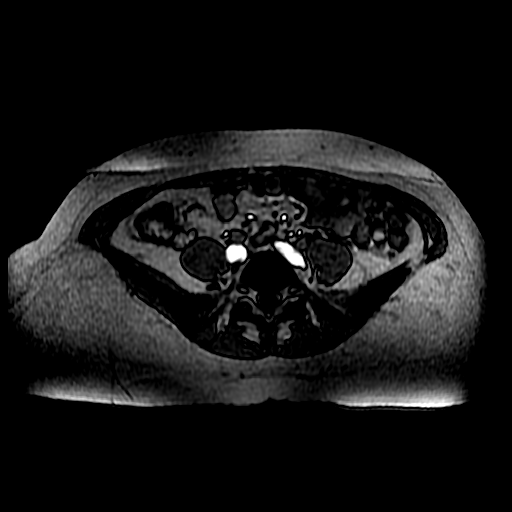
[im 43/128]
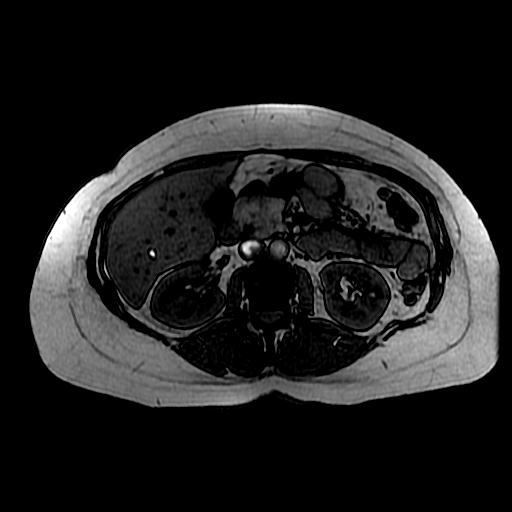
[im 85/128]
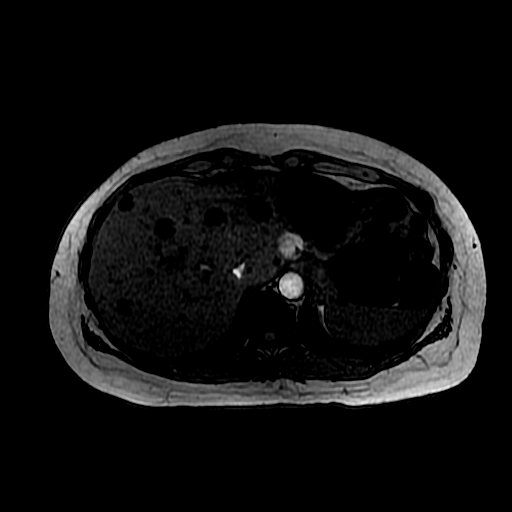
[im 128/128]
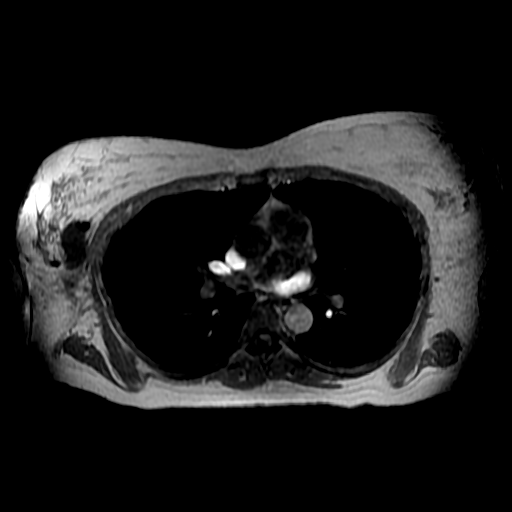

[Series 7: T2 · axial · 5.0mm · 0.78mm/px · z∈[-196,+119]mm · 2 of 64 slices shown (1 of 2)]
[im 1/64]
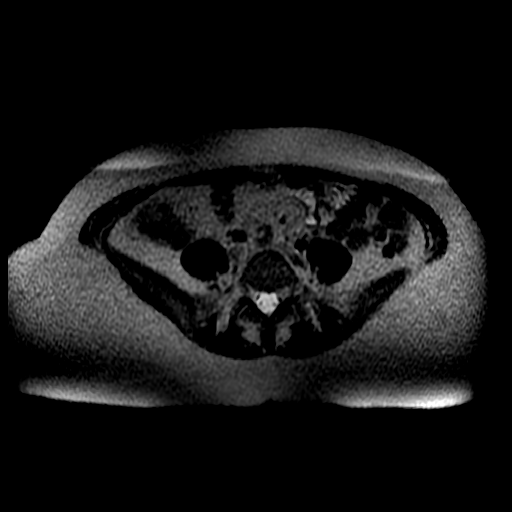
[im 64/64]
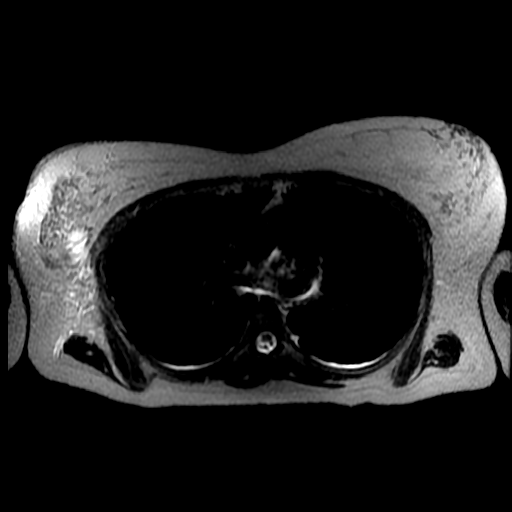

[Series 8: T2 · coronal · 5.0mm · 0.78mm/px · 1 of 42 slices shown (2 of 2)]
[im 1/42]
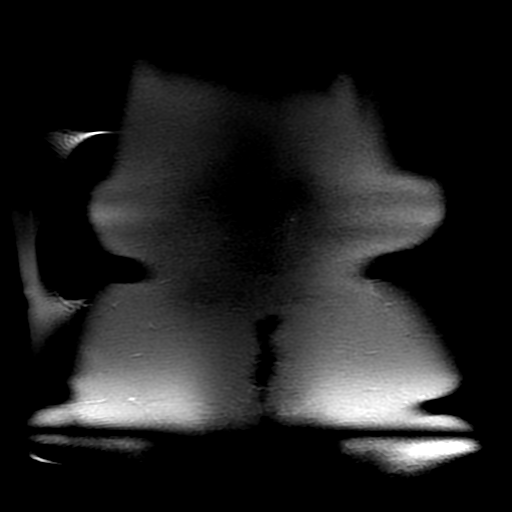

[Series 9: bSSFP · axial · 5.0mm · 0.78mm/px · z∈[-201,+119]mm · 2 of 65 slices shown]
[im 1/65]
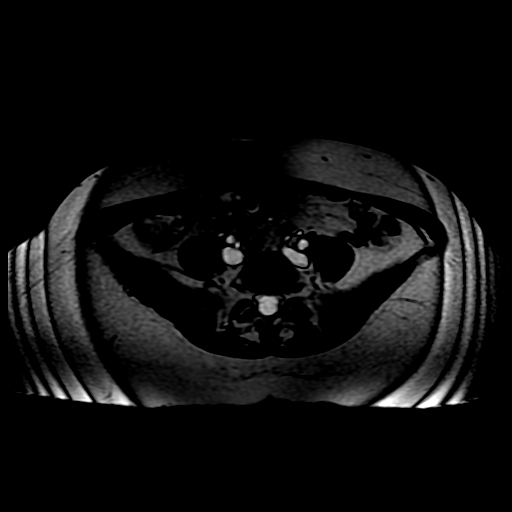
[im 65/65]
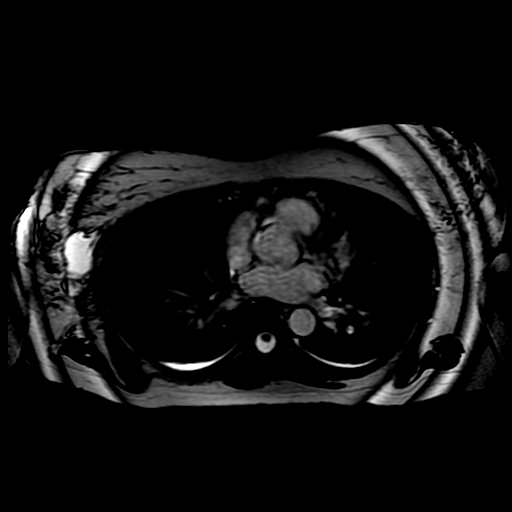

[Series 400: DWI · axial · 6.0mm · 1.48mm/px · 1 of 42 slices shown]
[im 1/42]
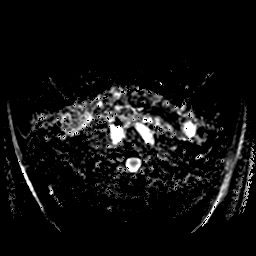

[Series 1000: T1 dynamic · axial · 6.0mm · 0.78mm/px · z∈[-204,+106]mm · 3 of 104 slices shown (1 of 2)]
[im 1/104]
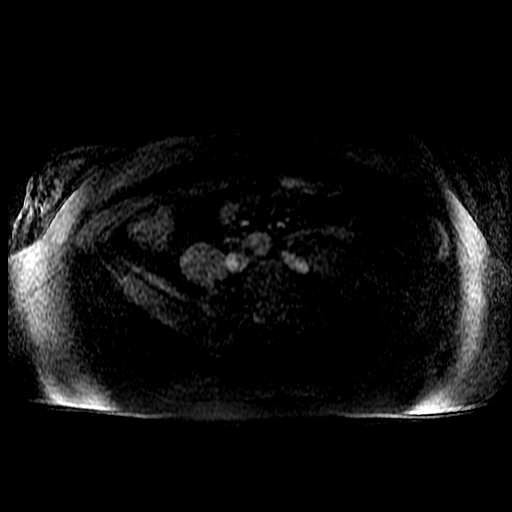
[im 52/104]
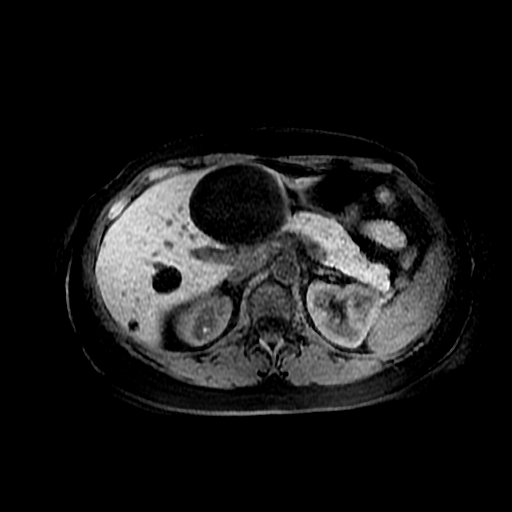
[im 104/104]
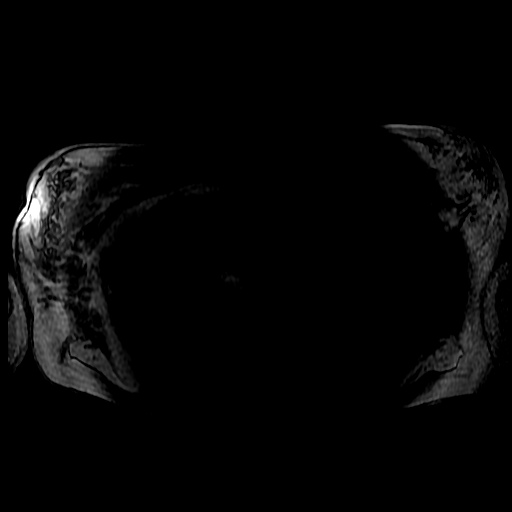

[Series 1001: T1 dynamic · axial · 6.0mm · 0.78mm/px · z∈[-204,-50]mm · 2 of 104 slices shown (2 of 2)]
[im 1/104]
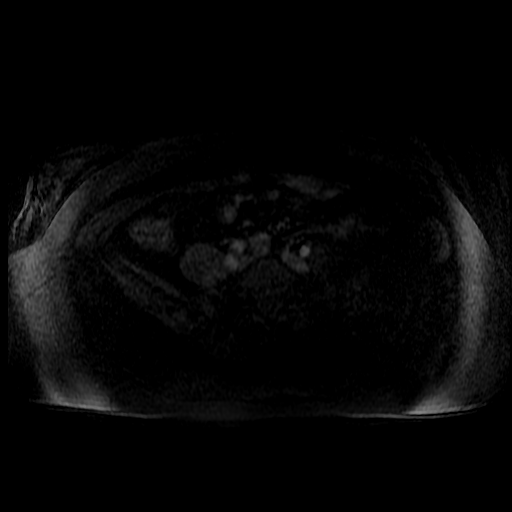
[im 52/104]
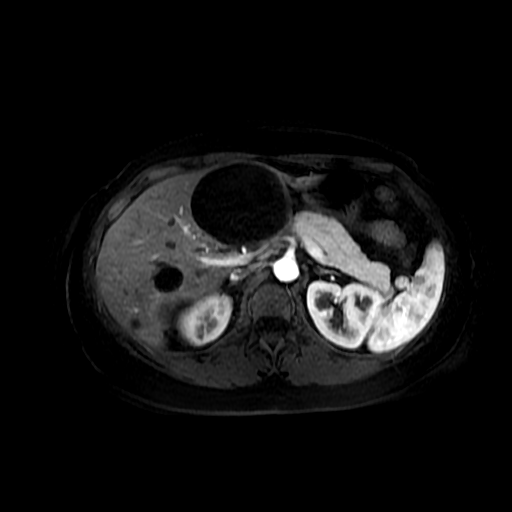

[20 of 48 positions shown; findings below may reference images not displayed]

FINDINGS: Lower chest: Normal heart size without pericardial or pleural
effusion. Fluid collection in the right breast is likely
postoperative. Example 2.7 cm on image 1 of series 3.

Hepatobiliary: Innumerable hepatic cysts. A cluster of cysts in the
lateral segment left liver lobe, including a dominant 6.5 x 5.3 cm
lesion on image 13 of series 3. Medial segment left liver lobe
dominant 5.9 x 7.5 cm lesion.

Marked hepatomegaly, 24.1 cm craniocaudal.  Mild hepatic steatosis.

Hyper enhancing focus in the posterior segment right liver lobe
measures 1.3 cm, including on image 41 of series 11992. Moderately
T2 hyperintense on image 16 of series 8. Most consistent with a
hemangioma.

No suspicious liver lesion. Normal gallbladder, without biliary
ductal dilatation.

Pancreas: Normal, without mass or ductal dilatation.

Spleen: Normal

Adrenals/Urinary Tract: Normal adrenal glands. Bilateral renal
cysts. Precontrast T1 hyperintense lesions are identified within
both kidneys. Example series 7000. No post-contrast enhancement in
any of these lesions. No hydronephrosis.

Stomach/Bowel: Normal stomach, without wall thickening. Normal
abdominal bowel loops.

Vascular/Lymphatic: Normal caliber of the aorta and branch vessels.
No retroperitoneal or retrocrural adenopathy.

Other: No ascites.

Musculoskeletal: No acute osseous abnormality.
IMPRESSION: 1. Innumerable hepatic and renal cysts. Findings are suspicious for
polycystic disease. Some renal cysts demonstrate complexity. No
enhancing renal lesion.
2. Hepatomegaly and hepatic steatosis.  Right hepatic hemangioma.

## 2016-09-16 ENCOUNTER — Other Ambulatory Visit: Payer: Self-pay | Admitting: Oncology

## 2016-09-16 DIAGNOSIS — Z853 Personal history of malignant neoplasm of breast: Secondary | ICD-10-CM

## 2016-10-30 ENCOUNTER — Ambulatory Visit
Admission: RE | Admit: 2016-10-30 | Discharge: 2016-10-30 | Disposition: A | Discharge status: Home / Self Care | Payer: No Typology Code available for payment source | Source: Ambulatory Visit | Attending: Oncology | Admitting: Oncology

## 2016-10-30 DIAGNOSIS — Z853 Personal history of malignant neoplasm of breast: Secondary | ICD-10-CM

## 2016-10-30 HISTORY — DX: Personal history of irradiation: Z92.3

## 2016-12-10 ENCOUNTER — Telehealth: Payer: Self-pay | Admitting: *Deleted

## 2016-12-10 NOTE — Telephone Encounter (Signed)
"  I have an appointment at work to receive the flu vaccine.  Just learned they need a letter from Dr. Jana Hakim that it is okay for me to receive the vaccine due to the medications I am on.  Fax letter, attn: [name] to office fax number: [phone]."

## 2017-07-14 ENCOUNTER — Inpatient Hospital Stay: Payer: No Typology Code available for payment source | Attending: Oncology

## 2017-07-14 DIAGNOSIS — C50411 Malignant neoplasm of upper-outer quadrant of right female breast: Secondary | ICD-10-CM

## 2017-07-14 DIAGNOSIS — R232 Flushing: Secondary | ICD-10-CM | POA: Insufficient documentation

## 2017-07-14 DIAGNOSIS — Z170 Estrogen receptor positive status [ER+]: Secondary | ICD-10-CM | POA: Insufficient documentation

## 2017-07-14 DIAGNOSIS — Z79810 Long term (current) use of selective estrogen receptor modulators (SERMs): Secondary | ICD-10-CM | POA: Diagnosis not present

## 2017-07-14 DIAGNOSIS — Z800 Family history of malignant neoplasm of digestive organs: Secondary | ICD-10-CM | POA: Insufficient documentation

## 2017-07-14 DIAGNOSIS — N281 Cyst of kidney, acquired: Secondary | ICD-10-CM | POA: Diagnosis not present

## 2017-07-14 DIAGNOSIS — F419 Anxiety disorder, unspecified: Secondary | ICD-10-CM | POA: Insufficient documentation

## 2017-07-14 DIAGNOSIS — Z923 Personal history of irradiation: Secondary | ICD-10-CM | POA: Diagnosis not present

## 2017-07-14 LAB — CBC WITH DIFFERENTIAL/PLATELET
BASOS PCT: 0 %
Basophils Absolute: 0 10*3/uL (ref 0.0–0.1)
EOS PCT: 3 %
Eosinophils Absolute: 0.2 10*3/uL (ref 0.0–0.5)
HEMATOCRIT: 38.6 % (ref 34.8–46.6)
Hemoglobin: 13.2 g/dL (ref 11.6–15.9)
LYMPHS PCT: 31 %
Lymphs Abs: 1.8 10*3/uL (ref 0.9–3.3)
MCH: 33.9 pg (ref 25.1–34.0)
MCHC: 34.2 g/dL (ref 31.5–36.0)
MCV: 99.2 fL (ref 79.5–101.0)
MONO ABS: 0.5 10*3/uL (ref 0.1–0.9)
MONOS PCT: 8 %
NEUTROS ABS: 3.4 10*3/uL (ref 1.5–6.5)
Neutrophils Relative %: 58 %
PLATELETS: 235 10*3/uL (ref 145–400)
RBC: 3.89 MIL/uL (ref 3.70–5.45)
RDW: 12.6 % (ref 11.2–14.5)
WBC: 5.9 10*3/uL (ref 3.9–10.3)

## 2017-07-14 LAB — COMPREHENSIVE METABOLIC PANEL
ALBUMIN: 3.6 g/dL (ref 3.5–5.0)
ALK PHOS: 46 U/L (ref 40–150)
ALT: 17 U/L (ref 0–55)
ANION GAP: 7 (ref 3–11)
AST: 20 U/L (ref 5–34)
BILIRUBIN TOTAL: 0.4 mg/dL (ref 0.2–1.2)
BUN: 16 mg/dL (ref 7–26)
CALCIUM: 9.2 mg/dL (ref 8.4–10.4)
CO2: 25 mmol/L (ref 22–29)
Chloride: 109 mmol/L (ref 98–109)
Creatinine, Ser: 0.81 mg/dL (ref 0.60–1.10)
GFR calc Af Amer: >60 mL/min (ref >60)
GFR calc non Af Amer: >60 mL/min (ref >60)
GLUCOSE: 107 mg/dL (ref 70–140)
Potassium: 4 mmol/L (ref 3.5–5.1)
SODIUM: 141 mmol/L (ref 136–145)
TOTAL PROTEIN: 6.6 g/dL (ref 6.4–8.3)
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
## 2017-07-16 ENCOUNTER — Telehealth: Payer: Self-pay | Admitting: Oncology

## 2017-07-16 ENCOUNTER — Inpatient Hospital Stay (HOSPITAL_BASED_OUTPATIENT_CLINIC_OR_DEPARTMENT_OTHER): Payer: No Typology Code available for payment source | Admitting: Oncology

## 2017-07-16 VITALS — BP 147/82 | HR 91 | Temp 98.4°F | Resp 18 | Ht 66.0 in | Wt 161.0 lb

## 2017-07-16 DIAGNOSIS — F419 Anxiety disorder, unspecified: Secondary | ICD-10-CM

## 2017-07-16 DIAGNOSIS — N281 Cyst of kidney, acquired: Secondary | ICD-10-CM | POA: Diagnosis not present

## 2017-07-16 DIAGNOSIS — R232 Flushing: Secondary | ICD-10-CM

## 2017-07-16 DIAGNOSIS — Z800 Family history of malignant neoplasm of digestive organs: Secondary | ICD-10-CM

## 2017-07-16 DIAGNOSIS — Z79810 Long term (current) use of selective estrogen receptor modulators (SERMs): Secondary | ICD-10-CM

## 2017-07-16 DIAGNOSIS — Z923 Personal history of irradiation: Secondary | ICD-10-CM | POA: Diagnosis not present

## 2017-07-16 DIAGNOSIS — C50411 Malignant neoplasm of upper-outer quadrant of right female breast: Secondary | ICD-10-CM | POA: Diagnosis not present

## 2017-07-16 DIAGNOSIS — Z170 Estrogen receptor positive status [ER+]: Secondary | ICD-10-CM | POA: Diagnosis not present

## 2017-07-16 MED ORDER — VENLAFAXINE HCL ER 75 MG PO CP24: 75.0000 mg | ORAL_CAPSULE | Freq: Every day | ORAL | 4 refills | Status: DC

## 2017-07-16 MED ORDER — TAMOXIFEN CITRATE 20 MG PO TABS: 20.0000 mg | ORAL_TABLET | Freq: Every day | ORAL | 3 refills | Status: DC

## 2017-07-16 NOTE — Telephone Encounter (Signed)
Gave patient AVS and calendar of upcoming April 2020 appointments.  °

## 2017-08-18 ENCOUNTER — Emergency Department (HOSPITAL_BASED_OUTPATIENT_CLINIC_OR_DEPARTMENT_OTHER): Payer: No Typology Code available for payment source

## 2017-08-18 ENCOUNTER — Encounter (HOSPITAL_BASED_OUTPATIENT_CLINIC_OR_DEPARTMENT_OTHER): Payer: Self-pay | Admitting: Emergency Medicine

## 2017-08-18 ENCOUNTER — Other Ambulatory Visit: Payer: Self-pay

## 2017-08-18 ENCOUNTER — Emergency Department (HOSPITAL_BASED_OUTPATIENT_CLINIC_OR_DEPARTMENT_OTHER)
Admission: EM | Admit: 2017-08-18 | Discharge: 2017-08-18 | Disposition: A | Discharge status: Home / Self Care | Payer: No Typology Code available for payment source | Attending: Emergency Medicine | Admitting: Emergency Medicine

## 2017-08-18 DIAGNOSIS — Z79899 Other long term (current) drug therapy: Secondary | ICD-10-CM | POA: Insufficient documentation

## 2017-08-18 DIAGNOSIS — R109 Unspecified abdominal pain: Secondary | ICD-10-CM | POA: Insufficient documentation

## 2017-08-18 DIAGNOSIS — M545 Low back pain, unspecified: Secondary | ICD-10-CM

## 2017-08-18 DIAGNOSIS — Z853 Personal history of malignant neoplasm of breast: Secondary | ICD-10-CM | POA: Diagnosis not present

## 2017-08-18 DIAGNOSIS — Z923 Personal history of irradiation: Secondary | ICD-10-CM | POA: Diagnosis not present

## 2017-08-18 DIAGNOSIS — M549 Dorsalgia, unspecified: Secondary | ICD-10-CM | POA: Diagnosis present

## 2017-08-18 LAB — URINALYSIS, ROUTINE W REFLEX MICROSCOPIC
BILIRUBIN URINE: NEGATIVE
Glucose, UA: NEGATIVE mg/dL
HGB URINE DIPSTICK: NEGATIVE
Ketones, ur: 15 mg/dL — AB
Leukocytes, UA: NEGATIVE
Nitrite: NEGATIVE
Protein, ur: NEGATIVE mg/dL
SPECIFIC GRAVITY, URINE: 1.015 (ref 1.005–1.030)
pH: 7 (ref 5.0–8.0)

## 2017-08-18 LAB — CBC WITH DIFFERENTIAL/PLATELET
Basophils Absolute: 0 10*3/uL (ref 0.0–0.1)
Basophils Relative: 0 %
EOS ABS: 0.1 10*3/uL (ref 0.0–0.7)
EOS PCT: 1 %
HCT: 38.4 % (ref 36.0–46.0)
HEMOGLOBIN: 13.1 g/dL (ref 12.0–15.0)
LYMPHS ABS: 0.9 10*3/uL (ref 0.7–4.0)
Lymphocytes Relative: 22 %
MCH: 33.7 pg (ref 26.0–34.0)
MCHC: 34.1 g/dL (ref 30.0–36.0)
MCV: 98.7 fL (ref 78.0–100.0)
MONOS PCT: 9 %
Monocytes Absolute: 0.4 10*3/uL (ref 0.1–1.0)
Neutro Abs: 2.7 10*3/uL (ref 1.7–7.7)
Neutrophils Relative %: 68 %
PLATELETS: 204 10*3/uL (ref 150–400)
RBC: 3.89 MIL/uL (ref 3.87–5.11)
RDW: 12.1 % (ref 11.5–15.5)
WBC: 4 10*3/uL (ref 4.0–10.5)

## 2017-08-18 LAB — COMPREHENSIVE METABOLIC PANEL
ALT: 16 U/L (ref 14–54)
AST: 20 U/L (ref 15–41)
Albumin: 3.5 g/dL (ref 3.5–5.0)
Alkaline Phosphatase: 39 U/L (ref 38–126)
Anion gap: 9 (ref 5–15)
BILIRUBIN TOTAL: 0.8 mg/dL (ref 0.3–1.2)
BUN: 11 mg/dL (ref 6–20)
CALCIUM: 8.6 mg/dL — AB (ref 8.9–10.3)
CHLORIDE: 107 mmol/L (ref 101–111)
CO2: 25 mmol/L (ref 22–32)
Creatinine, Ser: 0.74 mg/dL (ref 0.44–1.00)
Glucose, Bld: 119 mg/dL — ABNORMAL HIGH (ref 65–99)
Potassium: 4.4 mmol/L (ref 3.5–5.1)
Sodium: 141 mmol/L (ref 135–145)
TOTAL PROTEIN: 6.5 g/dL (ref 6.5–8.1)

## 2017-08-18 LAB — PREGNANCY, URINE: PREG TEST UR: NEGATIVE

## 2017-08-18 LAB — LIPASE, BLOOD: LIPASE: 23 U/L (ref 11–51)

## 2017-08-18 MED ORDER — OXYCODONE-ACETAMINOPHEN 5-325 MG PO TABS: 1.0000 | ORAL_TABLET | Freq: Four times a day (QID) | ORAL | 0 refills | Status: DC | PRN

## 2017-08-18 MED ORDER — PREDNISONE 20 MG PO TABS: ORAL_TABLET | ORAL | 0 refills | Status: DC

## 2017-08-18 MED ORDER — ONDANSETRON HCL 4 MG/2ML IJ SOLN
4.0000 mg | Freq: Once | INTRAMUSCULAR | Status: AC
  Administered 2017-08-18: 4 mg via INTRAVENOUS
  Filled 2017-08-18: qty 2

## 2017-08-18 MED ORDER — ONDANSETRON HCL 4 MG/2ML IJ SOLN
INTRAMUSCULAR | Status: AC
  Administered 2017-08-18: 4 mg
  Filled 2017-08-18: qty 2

## 2017-08-18 MED ORDER — HYDROMORPHONE HCL 1 MG/ML IJ SOLN
1.0000 mg | Freq: Once | INTRAMUSCULAR | Status: AC
  Administered 2017-08-18: 1 mg via INTRAVENOUS
  Filled 2017-08-18: qty 1

## 2017-08-18 MED ORDER — HYDROMORPHONE HCL 1 MG/ML IJ SOLN
0.5000 mg | Freq: Once | INTRAMUSCULAR | Status: AC
  Administered 2017-08-18: 0.5 mg via INTRAVENOUS
  Filled 2017-08-18: qty 1

## 2017-08-18 MED ORDER — CYCLOBENZAPRINE HCL 10 MG PO TABS: 10.0000 mg | ORAL_TABLET | Freq: Two times a day (BID) | ORAL | 0 refills | Status: DC | PRN

## 2017-08-18 MED ORDER — ONDANSETRON 4 MG PO TBDP: ORAL_TABLET | ORAL | 0 refills | Status: DC

## 2017-08-18 MED FILL — CYCLOBENZAPRINE HCL 10 MG T: 10 | 10 days supply | Qty: 20 | Fill #0

## 2017-08-18 MED FILL — OXYCODONE-ACETAMINOPHEN 5-3: 5-325 | 3 days supply | Qty: 15 | Fill #0

## 2017-08-18 MED FILL — ONDANSETRON ODT 4 MG TABLET: 4 | 2 days supply | Qty: 8 | Fill #0

## 2017-08-18 MED FILL — predniSONE 20 MG TABS: 20 | 5 days supply | Qty: 11 | Fill #0

## 2017-08-18 NOTE — ED Notes (Signed)
ED Provider at bedside. 

## 2017-08-18 NOTE — ED Triage Notes (Signed)
Sudden onset of Right low back pain when she twisted while cutting up vegetables in the kitchen.  Has gotten worse.  Pt tearful.  Sts she took a leftover Oxycodone at 3am and it helped for a couple hours. Pain radiates around right side of hip but not down leg or into abd.

## 2017-08-18 NOTE — ED Notes (Signed)
Patient transported to CT 

## 2017-08-18 NOTE — ED Provider Notes (Addendum)
[facility] EMERGENCY DEPARTMENT Provider Note   CSN: [id number] Arrival date & time: 08/18/17  4166     History   Chief Complaint Chief Complaint  Patient presents with  . Back Pain    HPI Brooke Ferguson is a 47 y.o. female.  Pt is a 47yo female who presents with right side back pain.  This started last night while she was cutting vegetable in the kitchen, she possibly twisted which led to pain.  No radiation down legs.  No numbness or weakness to legs.  No loss of bowel or bladder control.  She does have some radiation to her right mid abdomen.  No urinary symptoms.  No fevers.  No nausea or vomiting.  Pain is sharp and worse with movements.     Past Medical History:  Diagnosis Date  . Arthritis    bulging disc in upper neck C6C7  . Breast cancer of upper-outer quadrant of right female breast ([place]) 04/26/2014  . Bulging disc    BETWEEN C6-7, HAS HAD 2 CORTISONE INJECTIONS  . History of lumpectomy of right breast 05/17/14   With sentinel node biopsy  . Personal history of radiation therapy   . S/P radiation therapy 06/21/2014 through 07/13/2014    Right breast 4250 cGy in 17 sessions     Patient Active Problem List   Diagnosis Date Noted  . Genetic testing 05/31/2014  . Eczema, dyshidrotic 05/04/2014  . Hepatic cyst 05/04/2014  . Malignant neoplasm of upper-outer quadrant of right breast in female, estrogen receptor positive ([place]) 04/26/2014    Past Surgical History:  Procedure Laterality Date  . BREAST LUMPECTOMY Right 01/02/2015  . BREAST LUMPECTOMY WITH RADIOACTIVE SEED LOCALIZATION Left 01/05/2015   Procedure: BREAST LUMPECTOMY WITH RADIOACTIVE SEED LOCALIZATION;  Surgeon: Erroll Luna, MD;  Location: [facility];  Service: General;  Laterality: Left;  . DIAGNOSTIC LAPAROSCOPY     OVARIAN CYCTS AT AGE 44 YRS  . DILATION AND CURETTAGE OF UTERUS    . HYSTEROSCOPY  WITH RESECTOSCOPE  10/25/2010   Procedure: HYSTEROSCOPY WITH RESECTOSCOPE;  Surgeon: Lovenia Kim, MD;  Location: [facility];  Service: Gynecology;  Laterality: N/A;  Polypectomy  . WISDOM TOOTH EXTRACTION       OB History   None    Obstetric Comments  Patient's last menstrual period was 04/18/2014. Menarche age 35. The patient is GX P0. She still having regular periods. She used oral contraceptives between the ages of 9 and 14. She underwent hysteroscopy with "scraping" for uterine polyps in 2012. S he also is status post partial oophorectomy on the right because of an ovarian cyst.          Home Medications    Prior to Admission medications   Medication Sig Start Date End Date Taking? Authorizing Provider  cyclobenzaprine (FLEXERIL) 10 MG tablet Take 1 tablet (10 mg total) by mouth 2 (two) times daily as needed for muscle spasms. 08/18/17   Malvin Johns, MD  ibuprofen (ADVIL,MOTRIN) 800 MG tablet  06/30/14   [provider]  oxyCODONE-acetaminophen (PERCOCET) 5-325 MG tablet Take 1-2 tablets by mouth every 6 (six) hours as needed. 08/18/17   Malvin Johns, MD  predniSONE (DELTASONE) 20 MG tablet 3 tabs po day one, then 2 po daily x 4 days 08/18/17   Malvin Johns, MD  tamoxifen (NOLVADEX) 20 MG tablet Take 1 tablet (20 mg total) by mouth daily. 07/16/17   Magrinat, Virgie Dad, MD  venlafaxine XR (EFFEXOR-XR) 75 MG 24 hr  capsule Take 1 capsule (75 mg total) by mouth daily with breakfast. 07/16/17   Magrinat, Virgie Dad, MD    Family History Family History  Problem Relation Age of Onset  . Cancer Mother        astrocytoma; deceased 30  . Breast cancer Maternal Grandmother        Great-Great Grandmotherlung; smoker; currently 22  . Cancer Other        colon; mat grandmother's sister  . Cancer Other        esophagus; mat grandmother's sister    Social History Social History   Tobacco Use  . Smoking status: Never Smoker  . Smokeless tobacco: Never Used  Substance Use  Topics  . Alcohol use: Yes    Comment: SOCIALLY  . Drug use: No     Allergies   Vicodin [hydrocodone-acetaminophen]   Review of Systems Review of Systems  Constitutional: Negative for chills, diaphoresis, fatigue and fever.  HENT: Negative for congestion, rhinorrhea and sneezing.   Eyes: Negative.   Respiratory: Negative for cough, chest tightness and shortness of breath.   Cardiovascular: Negative for chest pain and leg swelling.  Gastrointestinal: Positive for abdominal pain. Negative for blood in stool, diarrhea, nausea and vomiting.  Genitourinary: Negative for difficulty urinating, flank pain, frequency and hematuria.  Musculoskeletal: Positive for back pain. Negative for arthralgias.  Skin: Negative for rash.  Neurological: Negative for dizziness, speech difficulty, weakness, numbness and headaches.     Physical Exam Updated Vital Signs BP (!) 149/108 (BP Location: Right Arm)   Pulse 96   Temp 98.8 F (37.1 C) (Oral)   Resp 16   Ht 5\' 6"  (1.676 m)   Wt 70.3 kg (155 lb)   LMP 12/22/2014   SpO2 99%   BMI 25.02 kg/m   Physical Exam  Constitutional: She is oriented to person, place, and time. She appears well-developed and well-nourished.  HENT:  Head: Normocephalic and atraumatic.  Eyes: Pupils are equal, round, and reactive to light.  Neck: Normal range of motion. Neck supple.  Cardiovascular: Normal rate, regular rhythm and normal heart sounds.  Pulmonary/Chest: Effort normal and breath sounds normal. No respiratory distress. She has no wheezes. She has no rales. She exhibits no tenderness.  Abdominal: Soft. Bowel sounds are normal. There is tenderness (Positive tenderness in the right mid abdomen and right flank). There is no rebound and no guarding.  Musculoskeletal: Normal range of motion. She exhibits no edema.  Positive tenderness in the right mid and lower back area along the musculature.  There is no spinal tenderness, positive straight leg raise on the  right.  She has normal motor function and sensation to light touch in the lower extremities bilaterally, pedal pulses are intact  Lymphadenopathy:    She has no cervical adenopathy.  Neurological: She is alert and oriented to person, place, and time.  Skin: Skin is warm and dry. No rash noted.  Psychiatric: She has a normal mood and affect.     ED Treatments / Results  Labs (all labs ordered are listed, but only abnormal results are displayed) Labs Reviewed  URINALYSIS, ROUTINE W REFLEX MICROSCOPIC - Abnormal; Notable for the following components:      Result Value   Ketones, ur 15 (*)    All other components within normal limits  COMPREHENSIVE METABOLIC PANEL - Abnormal; Notable for the following components:   Glucose, Bld 119 (*)    Calcium 8.6 (*)    All other components within normal limits  CBC WITH DIFFERENTIAL/PLATELET  PREGNANCY, URINE  LIPASE, BLOOD    EKG None  Radiology Ct Renal Stone Study  Result Date: 08/18/2017 CLINICAL DATA:  Sudden onset of right flank and low back pain yesterday. No acute injury. History of breast cancer. EXAM: CT ABDOMEN AND PELVIS WITHOUT CONTRAST TECHNIQUE: Multidetector CT imaging of the abdomen and pelvis was performed following the standard protocol without IV contrast. COMPARISON:  Abdominal MRI 05/30/2014. FINDINGS: Lower chest: Mild linear scarring anteriorly at the right lung base. The lung bases are otherwise clear. There is no pleural or pericardial effusion. Hepatobiliary: Innumerable up attic cysts are similar to previous MRI. No definite new or enlarging hepatic lesions. No evidence of gallstones, gallbladder wall thickening or biliary dilatation. Pancreas: Unremarkable. No pancreatic ductal dilatation or surrounding inflammatory changes. Spleen: Normal in size without focal abnormality. Adrenals/Urinary Tract: Both adrenal glands appear normal. There are punctate nonobstructing calculi in the lower pole of left kidney, best seen on  the coronal images. No evidence hydronephrosis, perinephric soft tissue stranding or ureteral calculus. There are small hyperdense renal lesions bilaterally, corresponding with cysts previous MRI. Additional water density cysts are present. No definite new or enlarging renal lesions. The bladder appears unremarkable. Stomach/Bowel: No evidence of bowel wall thickening, distention or surrounding inflammatory change. The appendix appears normal. There are scattered diverticular changes throughout the colon. Vascular/Lymphatic: There are no enlarged abdominal or pelvic lymph nodes. No significant vascular findings on noncontrast imaging. Reproductive: Right ovarian cyst/follicle, measuring 2.6 cm. No suspicious adnexal findings. The uterus appears normal. Other: There is no ascites or peritoneal nodularity. The anterior abdominal wall is intact. Musculoskeletal: No acute or significant osseous findings. IMPRESSION: 1. No acute findings or explanation for the patient's symptoms. 2. Small nonobstructing left renal calculi. No evidence of ureteral calculus or hydronephrosis. 3. The appendix appears normal. 4. Multiple hepatic and bilateral renal cysts, grossly stable from previous abdominal MRI. 5. No evidence of metastatic disease. Electronically Signed   By: Richardean Sale M.D.   On: 08/18/2017 09:31    Procedures Procedures (including critical care time)  Medications Ordered in ED Medications  HYDROmorphone (DILAUDID) injection 0.5 mg (has no administration in time range)  HYDROmorphone (DILAUDID) injection 1 mg (1 mg Intravenous Given 08/18/17 0920)  ondansetron (ZOFRAN) injection 4 mg (4 mg Intravenous Given 08/18/17 0918)     Initial Impression / Assessment and Plan / ED Course  I have reviewed the triage vital signs and the nursing notes.  Pertinent labs & imaging results that were available during my care of the patient were reviewed by me and considered in my medical decision making (see chart for  details).     Patient is a 47 year old female who presents with back pain.  It seems to be musculoskeletal nature although she did have some radiation to her abdomen and tenderness to her abdomen on palpation.  Given her severe pain and associated abdominal pain, labs and a CT were obtained.  Her CT scan shows no acute abnormalities.  There is no evidence of kidney stones in her ureters.  No evidence of infection.  She has stable polycystic kidney disease.  Her labs are non-concerning.  Her pain is improved without treatment in the ED.  It seems to be musculoskeletal in nature.  There is no radiation of the pain although there is a positive straight leg raise on the right.  She has no neurologic deficits or signs of cauda equina.  Her pain is controlled in the ED.  She  was discharged home in good condition.  She was given prescriptions for Flexeril, Percocet and prednisone.  She has no recent prescriptions for controlled substances on the reporting database.  She was given a referral to follow-up with Dr. Barbaraann Barthel or her PCP.  Return precautions were given.  Final Clinical Impressions(s) / ED Diagnoses   Final diagnoses:  Acute right-sided low back pain without sciatica    ED Discharge Orders        Ordered    oxyCODONE-acetaminophen (PERCOCET) 5-325 MG tablet  Every 6 hours PRN     08/18/17 1039    cyclobenzaprine (FLEXERIL) 10 MG tablet  2 times daily PRN     08/18/17 1039    predniSONE (DELTASONE) 20 MG tablet     08/18/17 1039       Malvin Johns, MD 08/18/17 1041    Malvin Johns, MD 08/29/17 1018

## 2017-08-20 ENCOUNTER — Ambulatory Visit (INDEPENDENT_AMBULATORY_CARE_PROVIDER_SITE_OTHER): Payer: No Typology Code available for payment source | Admitting: Family Medicine

## 2017-08-20 ENCOUNTER — Encounter: Payer: Self-pay | Admitting: Family Medicine

## 2017-08-20 VITALS — BP 133/91 | Ht 66.0 in | Wt 155.0 lb

## 2017-08-20 DIAGNOSIS — M79604 Pain in right leg: Secondary | ICD-10-CM | POA: Insufficient documentation

## 2017-08-20 DIAGNOSIS — M545 Low back pain, unspecified: Secondary | ICD-10-CM | POA: Insufficient documentation

## 2017-08-20 NOTE — Progress Notes (Signed)
PCP: Brien Few, MD  Subjective:   HPI: Patient is a 47 y.o. female here for low back pain.  Patient reports she's had pain in right side of low back since Sunday.  She recalls twisting after cutting cantaloupe and feeling a pop in low back. Pain is a stabbing pain with moving. She's had problems with her back before and sees chiropractor once a month. Had issues with known cervical bulging discs as well. States Sunday and Monday pain was down into right leg. Also radiating into right abdomen. She's currently taking prednisone from the ED and takes flexeril, percocet as needed but hasn't taken these two today. Went to work today and pain is worse. No bowel/bladder dysfunction. No skin changes.  Past Medical History:  Diagnosis Date  . Arthritis    bulging disc in upper neck C6C7  . Breast cancer of upper-outer quadrant of right female breast ([place]) 04/26/2014  . Bulging disc    BETWEEN C6-7, HAS HAD 2 CORTISONE INJECTIONS  . History of lumpectomy of right breast 05/17/14   With sentinel node biopsy  . Personal history of radiation therapy   . S/P radiation therapy 06/21/2014 through 07/13/2014    Right breast 4250 cGy in 17 sessions     Current Outpatient Medications on File Prior to Visit  Medication Sig Dispense Refill  . cyclobenzaprine (FLEXERIL) 10 MG tablet Take 1 tablet (10 mg total) by mouth 2 (two) times daily as needed for muscle spasms. 20 tablet 0  . ondansetron (ZOFRAN ODT) 4 MG disintegrating tablet 4mg  ODT q4 hours prn nausea/vomit 8 tablet 0  . oxyCODONE-acetaminophen (PERCOCET) 5-325 MG tablet Take 1-2 tablets by mouth every 6 (six) hours as needed. 15 tablet 0  . predniSONE (DELTASONE) 20 MG tablet 3 tabs po day one, then 2 po daily x 4 days 11 tablet 0  . tamoxifen (NOLVADEX) 20 MG tablet     . venlafaxine XR (EFFEXOR-XR) 75 MG 24 hr capsule      No current facility-administered  medications on file prior to visit.     Past Surgical History:  Procedure Laterality Date  . BREAST LUMPECTOMY Right 01/02/2015  . BREAST LUMPECTOMY WITH RADIOACTIVE SEED LOCALIZATION Left 01/05/2015   Procedure: BREAST LUMPECTOMY WITH RADIOACTIVE SEED LOCALIZATION;  Surgeon: Erroll Luna, MD;  Location: [facility];  Service: General;  Laterality: Left;  . DIAGNOSTIC LAPAROSCOPY     OVARIAN CYCTS AT AGE 60 YRS  . DILATION AND CURETTAGE OF UTERUS    . HYSTEROSCOPY WITH RESECTOSCOPE  10/25/2010   Procedure: HYSTEROSCOPY WITH RESECTOSCOPE;  Surgeon: Lovenia Kim, MD;  Location: [facility];  Service: Gynecology;  Laterality: N/A;  Polypectomy  . WISDOM TOOTH EXTRACTION      Allergies  Allergen Reactions  . Vicodin [Hydrocodone-Acetaminophen] Nausea And Vomiting    Social History   Socioeconomic History  . Marital status: Married    Spouse name: Not on file  . Number of children: Not on file  . Years of education: Not on file  . Highest education level: Not on file  Occupational History  . Occupation: Chemical engineer  Social Needs  . Financial resource strain: Not on file  . Food insecurity:    Worry: Not on file    Inability: Not on file  . Transportation needs:    Medical: Not on file    Non-medical: Not on file  Tobacco Use  . Smoking status: Never Smoker  . Smokeless tobacco: Never Used  Substance and Sexual  Activity  . Alcohol use: Yes    Comment: SOCIALLY  . Drug use: No  . Sexual activity: Yes    Birth control/protection: None, Other-see comments  Lifestyle  . Physical activity:    Days per week: Not on file    Minutes per session: Not on file  . Stress: Not on file  Relationships  . Social connections:    Talks on phone: Not on file    Gets together: Not on file    Attends religious service: Not on file    Active member of club or organization: Not on file    Attends meetings of clubs or organizations: Not on file    Relationship status:  Not on file  . Intimate partner violence:    Fear of current or ex partner: Not on file    Emotionally abused: Not on file    Physically abused: Not on file    Forced sexual activity: Not on file  Other Topics Concern  . Not on file  Social History Narrative  . Not on file    Family History  Problem Relation Age of Onset  . Cancer Mother        astrocytoma; deceased 20  . Breast cancer Maternal Grandmother        Great-Great Grandmotherlung; smoker; currently 50  . Cancer Other        colon; mat grandmother's sister  . Cancer Other        esophagus; mat grandmother's sister    BP (!) 133/91   Ht 5\' 6"  (1.676 m)   Wt 155 lb (70.3 kg)   LMP 12/22/2014   BMI 25.02 kg/m   Review of Systems: See HPI above.     Objective:  Physical Exam:  Gen: NAD, comfortable in exam room  Back: No gross deformity, scoliosis. TTP right lumbar paraspinal region.  No midline or bony TTP. FROM. Strength LEs 5/5 all muscle groups except 3/5 right knee flexion.   2+ MSRs in patellar and achilles tendons, equal bilaterally. Positive SLR on right, negative left. Sensation intact to light touch bilaterally.  Right hip: No deformity. FROM with 5/5 strength hip flexion. No tenderness to palpation. Sensation intact to light touch. Negative logroll. Negative fabers and piriformis stretches.   Assessment & Plan:  1. Low back pain radiating to right leg and abdomen - independently reviewed CT scan and no evidence abnormalities to account for her pain - renal stones are not within ureters.  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  Unfortunately not improving with prednisone dose pack and she has weakness with knee flexion.  Advised we go ahead with MRI to further assess.  Can finish prednisone dose pack.  Takepercocet, flexeril as needed.

## 2017-08-20 NOTE — Patient Instructions (Signed)
You have lumbar radiculopathy (a pinched nerve in your low back). You're not improving as expected with prednisone - we will go ahead with an MRI to further assess. Finish the prednisone as directed. Ok to take the flexeril and percocet as needed. Salon pas patches, capsaicin topically may help with the pain in addition to the above. You can follow up with me the day after the MRI to go over results (no charge visit) or I will call you.

## 2017-08-20 NOTE — Assessment & Plan Note (Signed)
independently reviewed CT scan and no evidence abnormalities to account for her pain - renal stones are not within ureters.  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  Unfortunately not improving with prednisone dose pack and she has weakness with knee flexion.  Advised we go ahead with MRI to further assess.  Can finish prednisone dose pack.  Takepercocet, flexeril as needed.

## 2017-08-25 ENCOUNTER — Ambulatory Visit
Admission: RE | Admit: 2017-08-25 | Discharge: 2017-08-25 | Disposition: A | Discharge status: Home / Self Care | Payer: No Typology Code available for payment source | Source: Ambulatory Visit | Attending: Family Medicine | Admitting: Family Medicine

## 2017-08-25 DIAGNOSIS — M545 Low back pain, unspecified: Secondary | ICD-10-CM

## 2017-08-26 ENCOUNTER — Ambulatory Visit (INDEPENDENT_AMBULATORY_CARE_PROVIDER_SITE_OTHER): Payer: No Typology Code available for payment source | Admitting: Family Medicine

## 2017-08-26 ENCOUNTER — Encounter: Payer: Self-pay | Admitting: Family Medicine

## 2017-08-26 DIAGNOSIS — M545 Low back pain: Secondary | ICD-10-CM

## 2017-08-26 DIAGNOSIS — M79604 Pain in right leg: Secondary | ICD-10-CM

## 2017-08-26 NOTE — Progress Notes (Signed)
Patient returned today for MRI results.  She is doing better compared to last week. States pain is minimal though she still feels it in her low back and is afraid to make quick movements for fear of causing recurrent pain.  MRI without evidence of disc herniation, radiculopathy.  She was given home exercise program to do daily - declined physical therapy for now.  Advised to f/u in 4 weeks.

## 2017-09-16 ENCOUNTER — Other Ambulatory Visit: Payer: Self-pay | Admitting: Oncology

## 2017-09-16 DIAGNOSIS — Z853 Personal history of malignant neoplasm of breast: Secondary | ICD-10-CM

## 2017-09-17 ENCOUNTER — Ambulatory Visit: Payer: No Typology Code available for payment source | Admitting: Family Medicine

## 2017-11-06 ENCOUNTER — Ambulatory Visit
Admission: RE | Admit: 2017-11-06 | Discharge: 2017-11-06 | Disposition: A | Discharge status: Home / Self Care | Payer: PRIVATE HEALTH INSURANCE | Source: Ambulatory Visit | Attending: Oncology | Admitting: Oncology

## 2017-11-06 DIAGNOSIS — Z853 Personal history of malignant neoplasm of breast: Secondary | ICD-10-CM

## 2017-12-04 ENCOUNTER — Telehealth: Payer: Self-pay | Admitting: Oncology

## 2017-12-04 NOTE — Telephone Encounter (Signed)
Tried to call regarding voicemail and the mailbox was full

## 2018-06-23 ENCOUNTER — Other Ambulatory Visit: Payer: No Typology Code available for payment source

## 2018-06-25 ENCOUNTER — Ambulatory Visit: Payer: No Typology Code available for payment source | Admitting: Oncology

## 2018-06-29 ENCOUNTER — Other Ambulatory Visit: Payer: Self-pay | Admitting: Oncology

## 2018-07-14 ENCOUNTER — Inpatient Hospital Stay: Payer: Commercial Managed Care - PPO | Attending: Oncology

## 2018-07-14 ENCOUNTER — Other Ambulatory Visit: Payer: Self-pay

## 2018-07-14 DIAGNOSIS — C50411 Malignant neoplasm of upper-outer quadrant of right female breast: Secondary | ICD-10-CM | POA: Insufficient documentation

## 2018-07-14 DIAGNOSIS — Z79810 Long term (current) use of selective estrogen receptor modulators (SERMs): Secondary | ICD-10-CM | POA: Insufficient documentation

## 2018-07-14 DIAGNOSIS — Z170 Estrogen receptor positive status [ER+]: Secondary | ICD-10-CM | POA: Diagnosis not present

## 2018-07-14 LAB — COMPREHENSIVE METABOLIC PANEL
ALT: 26 U/L (ref 0–44)
AST: 30 U/L (ref 15–41)
Albumin: 3.4 g/dL — ABNORMAL LOW (ref 3.5–5.0)
Alkaline Phosphatase: 56 U/L (ref 38–126)
Anion gap: 9 (ref 5–15)
BUN: 15 mg/dL (ref 6–20)
CO2: 23 mmol/L (ref 22–32)
Calcium: 9.2 mg/dL (ref 8.9–10.3)
Chloride: 108 mmol/L (ref 98–111)
Creatinine, Ser: 0.83 mg/dL (ref 0.44–1.00)
GFR calc Af Amer: >60 mL/min (ref >60)
GFR calc non Af Amer: >60 mL/min (ref >60)
Glucose, Bld: 97 mg/dL (ref 70–99)
Potassium: 4.3 mmol/L (ref 3.5–5.1)
Sodium: 140 mmol/L (ref 135–145)
Total Bilirubin: 0.5 mg/dL (ref 0.3–1.2)
Total Protein: 6.6 g/dL (ref 6.5–8.1)

## 2018-07-14 LAB — CBC WITH DIFFERENTIAL/PLATELET
Abs Immature Granulocytes: 0.02 10*3/uL (ref 0.00–0.07)
Basophils Absolute: 0 10*3/uL (ref 0.0–0.1)
Basophils Relative: 0 %
Eosinophils Absolute: 0.1 10*3/uL (ref 0.0–0.5)
Eosinophils Relative: 1 %
HCT: 39.3 % (ref 36.0–46.0)
Hemoglobin: 13.1 g/dL (ref 12.0–15.0)
Immature Granulocytes: 0 %
Lymphocytes Relative: 32 %
Lymphs Abs: 1.9 10*3/uL (ref 0.7–4.0)
MCH: 34.1 pg — ABNORMAL HIGH (ref 26.0–34.0)
MCHC: 33.3 g/dL (ref 30.0–36.0)
MCV: 102.3 fL — ABNORMAL HIGH (ref 80.0–100.0)
Monocytes Absolute: 0.5 10*3/uL (ref 0.1–1.0)
Monocytes Relative: 9 %
Neutro Abs: 3.4 10*3/uL (ref 1.7–7.7)
Neutrophils Relative %: 58 %
Platelets: 198 10*3/uL (ref 150–400)
RBC: 3.84 MIL/uL — ABNORMAL LOW (ref 3.87–5.11)
RDW: 12.3 % (ref 11.5–15.5)
WBC: 5.9 10*3/uL (ref 4.0–10.5)
nRBC: 0 % (ref 0.0–0.2)

## 2018-07-16 ENCOUNTER — Telehealth: Payer: Self-pay | Admitting: Oncology

## 2018-07-16 NOTE — Telephone Encounter (Signed)
Called patient regarding upcoming [facility] appointment, left patient a voicemail and sent an e-mail.

## 2018-07-20 NOTE — Progress Notes (Signed)
[place]  Telephone:([phone]) [phone] Fax:(336) [phone]     ID: Brooke Ferguson DOB: 03-03-1971  MR#: [id number]  KGY#:[id number]  Patient Care Team: Lorenda Hatchet, [place] as PCP - General (Family Medicine) Erroll Luna, MD as Consulting Physician (General Surgery) Wendel Homeyer, Virgie Dad, MD as Consulting Physician (Oncology) Arloa Koh, MD as Consulting Physician (Radiation Oncology) Rockwell Germany, RN as Registered Nurse Mauro Kaufmann, RN as Registered Nurse Brien Few, MD as Consulting Physician (Obstetrics and Gynecology) OTHER MD: Jamal Maes MD  I connected with Brooke Ferguson on 07/21/18 at  3:00 PM EDT by video enabled telemedicine visit and verified that I am speaking with the correct person using two identifiers.   I discussed the limitations, risks, security and privacy concerns of performing an evaluation and management service by telemedicine and the availability of in-person appointments. I also discussed with the patient that there may be a patient responsible charge related to this service. The patient expressed understanding and agreed to proceed.   Other persons participating in the visit and their role in the encounter: Wilburn Mylar, scribe   Patients location: home  Providers location: [place]: early-stage estrogen receptor positive breast cancer  CURRENT TREATMENT: tamoxifen   INTERVAL HISTORY: Brooke Ferguson returns today for follow-up of her estrogen receptor positive breast cancer.   She continues on tamoxifen, with good tolerance. She reports night sweats once a week or every other week, but she notes they're tolerable. She notes them occasionally during the day. She has been having regular periods the last two months.   Since her last visit, she underwent bilateral diagnostic mammography with tomography at [facility] on 11/06/2017 showing: breast density category B; no evidence  of malignancy in either breast.   REVIEW OF SYSTEMS: Brooke Ferguson reports doing well overall. She states she usually gets vitamin B12 injections from her PCP. Since she has not been since March, she notes her anxiety has gone back up. She walks around her neighborhood for exercise, but she normally goes to the gym. She has been working from home since March. Her husband usually does the shopping, but she likes to go to get out of the house. Her husband works from home most of the time, but he travels sometimes.   The patient denies unusual headaches, visual changes, nausea, vomiting, stiff neck, dizziness, or gait imbalance. There has been no cough, phlegm production, or pleurisy, no chest pain or pressure, and no change in bowel or bladder habits. The patient denies fever, rash, bleeding, unexplained fatigue or unexplained weight loss. A detailed review of systems was otherwise entirely negative.   BREAST CANCER HISTORY: From the original intake note:  Brooke Ferguson has received yearly mammography since age 62. She has breast density category C. On 09/30/2013 bilateral screening mammography showed a possible asymmetry in the right breast. Right diagnostic mammography with tomosynthesis and right breast ultrasonography was performed 10/08/2013. On spot compression views there appears to be a persistent mixed attenuation mass in the right breast suggesting an intramammary lymph node. Ultrasound showed several small cysts not related to this finding. There was a diagnosed in her graphic abnormality noted associated with a possible abnormality.   Accordingly 6 month follow-up was suggested and performed 04/12/2014. On this date right diagnostic mammography and ultrasonography showed an oval 5 mm mass with indistinct med margins in the upper right breast. This was not palpable. Targeted ultrasound now found a hypoechoic mass measuring 5 mm in the  area in question, and this was biopsied 04/22/2014. The pathology  showed ([id number] [id number]) and invasive ductal carcinoma, grade 1, estrogen receptor 100% positive, progesterone receptor 97% positive, both with strong staining intensity, with an MIB-1 of 12%, and no HER-2 amplification, the signals ratio being 1.21 and the number per cell 2.00.  On 05/02/2014 the patient underwent bilateral breast MRI. This found the breast density to be category B. In the upper outer quadrant of the right breast there was a 6 mm enhancing mass with slightly irregular margins. There were no abnormal lymph nodes and the left breast was unremarkable. In the upper abdomen and left upper quadrant there were innumerable cysts, some measuring up to 6 cm in size.  The patient's subsequent history is as detailed below.   PAST MEDICAL HISTORY: Past Medical History:  Diagnosis Date   Arthritis    bulging disc in upper neck C6C7   Breast cancer of upper-outer quadrant of right female breast ([place]) 04/26/2014   Bulging disc    BETWEEN C6-7, HAS HAD 2 CORTISONE INJECTIONS   History of lumpectomy of right breast 05/17/14   With sentinel node biopsy   Personal history of radiation therapy    S/P radiation therapy 06/21/2014 through 07/13/2014    Right breast 4250 cGy in 17 sessions     PAST SURGICAL HISTORY: Past Surgical History:  Procedure Laterality Date   BREAST EXCISIONAL BIOPSY Left 01/2015   benign   BREAST LUMPECTOMY Right 01/02/2015   BREAST LUMPECTOMY WITH RADIOACTIVE SEED LOCALIZATION Left 01/05/2015   Procedure: BREAST LUMPECTOMY WITH RADIOACTIVE SEED LOCALIZATION;  Surgeon: Erroll Luna, MD;  Location: [facility];  Service: General;  Laterality: Left;   DIAGNOSTIC LAPAROSCOPY     OVARIAN CYCTS AT AGE 15 YRS   DILATION AND CURETTAGE OF UTERUS     HYSTEROSCOPY WITH RESECTOSCOPE  10/25/2010   Procedure: HYSTEROSCOPY WITH RESECTOSCOPE;  Surgeon: Lovenia Kim, MD;  Location: [facility];  Service: Gynecology;  Laterality: N/A;  Polypectomy   WISDOM TOOTH EXTRACTION      FAMILY HISTORY Family History  Problem Relation Age of Onset   Cancer Mother        astrocytoma; deceased 82   Breast cancer Maternal Grandmother        Great-Great Grandmotherlung; smoker; currently 23   Cancer Other        colon; mat grandmother's sister   Cancer Other        esophagus; mat grandmother's sister   The patient's father is alive, age 108 as of May 22, 2014. The patient's mother died at age 70 from an astrocytoma which was diagnosed a year earlier. The patient has no brothers, one sister. There is no history of breast cancer in the the immediate family. On the maternal side however the patient's mother's grandmother lived to be 40 but had been diagnosed with breast cancer at some point. There are also cases of lung cancer, esophageal cancer, and colon cancer on the mother's side of the family. There are no other breast or ovarian cancers to the patient's knowledge---At the 08/29/2014 visited the patient explained she has found further information regarding her family. There is a cousin who developed breast cancer in her 70s. There is another cousin, but she does not know the age, undergoing dialysis. She does not know the reason for end-stage renal disease on that person   GYNECOLOGIC HISTORY:  Patient's last menstrual period was 06/22/2018 (within weeks). Menarche age 28. The patient is GX P0.  She still having regular periods. She used oral contraceptives between the ages of 89 and 63. She underwent hysteroscopy with "scraping" for uterine polyps in 2012. 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 Brooke Ferguson works for a [employer] (they do a lot of the  testing for defibrillators.). A stepson, Zack, 76, graduated from [employer] and has a job and lives independently. The  patient is not a church attender    ADVANCED DIRECTIVES: not in place   HEALTH MAINTENANCE: Social History   Tobacco Use   Smoking status: Never Smoker   Smokeless tobacco: Never Used  Substance Use Topics   Alcohol use: Yes    Comment: SOCIALLY   Drug use: No     Colonoscopy:never  LGX:QJJHER 2015  Bone density:never  Lipid panel:  Allergies  Allergen Reactions   Vicodin [Hydrocodone-Acetaminophen] Nausea And Vomiting    Current Outpatient Medications  Medication Sig Dispense Refill   tamoxifen (NOLVADEX) 20 MG tablet TAKE 1 TABLET BY MOUTH  DAILY 90 tablet 4   No current facility-administered medications for this visit.     OBJECTIVE: young White woman who appears well  There were no vitals filed for this visit.   There is no height or weight on file to calculate BMI.    ECOG FS:0 - Asymptomatic  LAB RESULTS:  CMP     Component Value Date/Time   NA 140 07/14/2018 1512   NA 141 07/15/2016 1617   K 4.3 07/14/2018 1512   K 4.6 07/15/2016 1617   CL 108 07/14/2018 1512   CO2 23 07/14/2018 1512   CO2 23 07/15/2016 1617   GLUCOSE 97 07/14/2018 1512   GLUCOSE 102 07/15/2016 1617   BUN 15 07/14/2018 1512   BUN 14.2 07/15/2016 1617   CREATININE 0.83 07/14/2018 1512   CREATININE 0.7 07/15/2016 1617   CALCIUM 9.2 07/14/2018 1512   CALCIUM 9.5 07/15/2016 1617   PROT 6.6 07/14/2018 1512   PROT 6.5 07/15/2016 1617   ALBUMIN 3.4 (L) 07/14/2018 1512   ALBUMIN 3.6 07/15/2016 1617   AST 30 07/14/2018 1512   AST 23 07/15/2016 1617   ALT 26 07/14/2018 1512   ALT 23 07/15/2016 1617   ALKPHOS 56 07/14/2018 1512   ALKPHOS 42 07/15/2016 1617   BILITOT 0.5 07/14/2018 1512   BILITOT 0.52 07/15/2016 1617   GFRNONAA >60 07/14/2018 1512   GFRAA >60 07/14/2018 1512    INo results found for: SPEP, UPEP  Lab Results  Component Value Date   WBC 5.9 07/14/2018   NEUTROABS 3.4 07/14/2018   HGB 13.1 07/14/2018   HCT 39.3 07/14/2018   MCV 102.3 (H) 07/14/2018    PLT 198 07/14/2018      Chemistry      Component Value Date/Time   NA 140 07/14/2018 1512   NA 141 07/15/2016 1617   K 4.3 07/14/2018 1512   K 4.6 07/15/2016 1617   CL 108 07/14/2018 1512   CO2 23 07/14/2018 1512   CO2 23 07/15/2016 1617   BUN 15 07/14/2018 1512   BUN 14.2 07/15/2016 1617   CREATININE 0.83 07/14/2018 1512   CREATININE 0.7 07/15/2016 1617      Component Value Date/Time   CALCIUM 9.2 07/14/2018 1512   CALCIUM 9.5 07/15/2016 1617   ALKPHOS 56 07/14/2018 1512   ALKPHOS 42 07/15/2016 1617   AST 30 07/14/2018 1512   AST 23  07/15/2016 1617   ALT 26 07/14/2018 1512   ALT 23 07/15/2016 1617   BILITOT 0.5 07/14/2018 1512   BILITOT 0.52 07/15/2016 1617       No results found for: LABCA2  No components found for: LABCA125  No results for input(s): INR in the last 168 hours.  Urinalysis    Component Value Date/Time   COLORURINE YELLOW 08/18/2017 0923   APPEARANCEUR CLEAR 08/18/2017 0923   LABSPEC 1.015 08/18/2017 0923   PHURINE 7.0 08/18/2017 0923   GLUCOSEU NEGATIVE 08/18/2017 0923   HGBUR NEGATIVE 08/18/2017 0923   BILIRUBINUR NEGATIVE 08/18/2017 0923   KETONESUR 15 (A) 08/18/2017 0923   PROTEINUR NEGATIVE 08/18/2017 0923   UROBILINOGEN 0.2 03/18/2011 1322   NITRITE NEGATIVE 08/18/2017 0923   LEUKOCYTESUR NEGATIVE 08/18/2017 0923    STUDIES: Next mammogram is due August 2019  ASSESSMENT: 48 y.o. BRCA negative [place] woman status post right breast upper outer quadrant biopsy 04/22/2014 for a clinical T1b N0, stage IA  Invasive ductal carcinoma, grade 1, strongly estrogen and progesterone receptor positive, HER-2 negative, with an MIB-1 of 12%   (1) genetics testing using the OvaNext gene panel/ [facility] showed no deleterious mutations in ATM, BARD1, BRCA1, BRCA2, BRIP1, CDH1, CHEK2, EPCAM, MLH1, MRE11A, MSH2, MSH6, MUTYH, NBN, NF1, PALB2, PMS2, PTEN, RAD50, RAD51C, RAD51D, SMARCA4, STK11, or TP53.   (2) right partial mastectomy with  sentinel lymph node sampling 05/17/2014 showed a pT1b pN0, stage IA invasive ductal carcinoma, grade 1, with negative margins.   (3) Oncotype DX score of 9 predicts a 10 year risk of outside the breast recurrence of 6% if the patient's only systemic treatment is tamoxifen for 5 years. It also predicts no benefit from chemotherapy.   (4) adjuvant radiation 06/21/2014 through 07/13/2014: Right breast 4250 cGy in 17 sessions   (5) tamoxifen started 08/29/2014  (6) left breast UOQ biopsy 10/28/2014 shows complex sclerosing lesion  (a) left lumpectomy 01/05/15 revealed fibrocystic changes with adenosis and calcifications. 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  This is very favorable.  She is tolerating tamoxifen well and the plan will be to continue that a minimum of 5 years.  She understands that tamoxifen is not a contraceptive.  Her menstrual periods are currently very irregular.  She has had ablation so the possibility of pregnancy is low but certainly not 0.  She will let me know if she has any concerns regarding that  I encouraged her to exercise a little bit more regularly.  Otherwise I will set her up for mammography later this year and of course a visit in about 6 months  She knows to call for any other problems that may develop before the next visit.  Caroline Longie, Virgie Dad, MD  07/21/18 3:11 PM Medical Oncology and Hematology [facility] [address] [place], [place] [address] Tel. [phone]    Fax. [phone]   I, Wilburn Mylar, am acting as scribe for Dr. Virgie Dad. Donjuan Robison.  I, Lurline Del MD, have reviewed the above documentation for accuracy and completeness, and I agree with the above.

## 2018-07-21 ENCOUNTER — Inpatient Hospital Stay (HOSPITAL_BASED_OUTPATIENT_CLINIC_OR_DEPARTMENT_OTHER): Payer: Commercial Managed Care - PPO | Admitting: Oncology

## 2018-07-21 DIAGNOSIS — Z923 Personal history of irradiation: Secondary | ICD-10-CM | POA: Diagnosis not present

## 2018-07-21 DIAGNOSIS — C50411 Malignant neoplasm of upper-outer quadrant of right female breast: Secondary | ICD-10-CM

## 2018-07-21 DIAGNOSIS — Z170 Estrogen receptor positive status [ER+]: Secondary | ICD-10-CM | POA: Diagnosis not present

## 2018-07-21 DIAGNOSIS — Z79810 Long term (current) use of selective estrogen receptor modulators (SERMs): Secondary | ICD-10-CM | POA: Diagnosis not present

## 2018-07-21 DIAGNOSIS — Z9011 Acquired absence of right breast and nipple: Secondary | ICD-10-CM

## 2018-07-23 ENCOUNTER — Telehealth: Payer: Self-pay | Admitting: Oncology

## 2018-07-23 NOTE — Telephone Encounter (Signed)
Tried to reach regarding schedule °

## 2018-11-17 ENCOUNTER — Ambulatory Visit
Admission: RE | Admit: 2018-11-17 | Discharge: 2018-11-17 | Disposition: A | Discharge status: Home / Self Care | Payer: Commercial Managed Care - PPO | Source: Ambulatory Visit | Attending: Oncology | Admitting: Oncology

## 2018-11-17 ENCOUNTER — Other Ambulatory Visit: Payer: Self-pay

## 2018-11-17 DIAGNOSIS — C50411 Malignant neoplasm of upper-outer quadrant of right female breast: Secondary | ICD-10-CM

## 2018-11-20 ENCOUNTER — Other Ambulatory Visit: Payer: Self-pay | Admitting: Nephrology

## 2018-11-20 DIAGNOSIS — Q613 Polycystic kidney, unspecified: Secondary | ICD-10-CM

## 2018-11-26 ENCOUNTER — Ambulatory Visit
Admission: RE | Admit: 2018-11-26 | Discharge: 2018-11-26 | Disposition: A | Discharge status: Home / Self Care | Payer: Commercial Managed Care - PPO | Source: Ambulatory Visit | Attending: Nephrology | Admitting: Nephrology

## 2018-11-26 DIAGNOSIS — Q613 Polycystic kidney, unspecified: Secondary | ICD-10-CM

## 2019-01-20 NOTE — Progress Notes (Signed)
Brooke Ferguson  [phone] [phone]     ID: Brooke Ferguson DOB: 1970/09/11  MR#: [id number]  QVZ#:[id number]  Patient Care Team: Lorenda Hatchet, FNP as PCP - General (Family Medicine) Erroll Luna, MD as Consulting Physician (General Surgery) Magrinat, Virgie Dad, MD as Consulting Physician (Oncology) Arloa Koh, MD (Inactive) as Consulting Physician (Radiation Oncology) Rockwell Germany, RN as Registered Nurse Mauro Kaufmann, RN as Registered Nurse Brien Few, MD as Consulting Physician (Obstetrics and Gynecology) OTHER MD: Jamal Maes MD  CHIEF COMPLAINT: early-stage estrogen receptor positive breast cancer  CURRENT TREATMENT: tamoxifen   INTERVAL HISTORY: Brooke Ferguson returns today for follow-up of her estrogen receptor positive breast cancer accompanied by her husband Brooke Ferguson.   She continues on tamoxifen.  She tolerates this remarkably well.  She does not have hot flashes.  She does not have any vaginal wetness.  She has very irregular periods.  Recall that she had an ablation procedure in the past.  They are not using any contraception and she has not gotten pregnant.  She does understand that tamoxifen is not a contraceptive  Since her last visit, she underwent bilateral diagnostic mammography with tomography at [facility] on 11/17/2018 showing: breast density category C; no evidence of malignancy in either breast.  She also underwent renal ultrasound on 11/26/2018, which showed: cystic areas in the kidneys, significantly less notable than on prior 2016 MRI and 2019 CT; no obstructing focus in either kidney; no clearly noncystic renal lesions evident.   REVIEW OF SYSTEMS: Brooke Ferguson is not exercising regularly.  She keeps a little bit of a cough, worse in the morning, not severe, and she does have a history of getting bronchitis problems over the winter.  She does not have a history of asthma.  A detailed review of systems  today was otherwise noncontributory   BREAST CANCER HISTORY: From the original intake note:  [name] has received yearly mammography since age 21. She has breast density category C. On 09/30/2013 bilateral screening mammography showed a possible asymmetry in the right breast. Right diagnostic mammography with tomosynthesis and right breast ultrasonography was performed 10/08/2013. On spot compression views there appears to be a persistent mixed attenuation mass in the right breast suggesting an intramammary lymph node. Ultrasound showed several small cysts not related to this finding. There was a diagnosed in her graphic abnormality noted associated with a possible abnormality.   Accordingly 6 month follow-up was suggested and performed 04/12/2014. On this date right diagnostic mammography and ultrasonography showed an oval 5 mm mass with indistinct med margins in the upper right breast. This was not palpable. Targeted ultrasound now found a hypoechoic mass measuring 5 mm in the area in question, and this was biopsied 04/22/2014. The pathology showed (SAA [id number]) and invasive ductal carcinoma, grade 1, estrogen receptor 100% positive, progesterone receptor 97% positive, both with strong staining intensity, with an MIB-1 of 12%, and no HER-2 amplification, the signals ratio being 1.21 and the number per cell 2.00.  On 05/02/2014 the patient underwent bilateral breast MRI. This found the breast density to be category B. In the upper outer quadrant of the right breast there was a 6 mm enhancing mass with slightly irregular margins. There were no abnormal lymph nodes and the left breast was unremarkable. In the upper abdomen and left upper quadrant there were innumerable cysts, some measuring up to 6 cm in size.  The patient's subsequent history is as detailed below.   PAST MEDICAL HISTORY: Past  Medical History:  Diagnosis Date  . Arthritis    bulging disc in upper neck C6C7  . Breast cancer of  upper-outer quadrant of right female breast ([facility]) 04/26/2014  . Bulging disc    BETWEEN C6-7, HAS HAD 2 CORTISONE INJECTIONS  . History of lumpectomy of right breast 05/17/14   With sentinel node biopsy  . Personal history of radiation therapy   . S/P radiation therapy 06/21/2014 through 07/13/2014    Right breast 4250 cGy in 17 sessions     PAST SURGICAL HISTORY: Past Surgical History:  Procedure Laterality Date  . BREAST EXCISIONAL BIOPSY Left 01/2015   benign  . BREAST LUMPECTOMY Right 01/02/2015  . BREAST LUMPECTOMY WITH RADIOACTIVE SEED LOCALIZATION Left 01/05/2015   Procedure: BREAST LUMPECTOMY WITH RADIOACTIVE SEED LOCALIZATION;  Surgeon: Erroll Luna, MD;  Location: [facility];  Service: General;  Laterality: Left;  . DIAGNOSTIC LAPAROSCOPY     OVARIAN CYCTS AT AGE 57 YRS  . DILATION AND CURETTAGE OF UTERUS    . HYSTEROSCOPY WITH RESECTOSCOPE  10/25/2010   Procedure: HYSTEROSCOPY WITH RESECTOSCOPE;  Surgeon: Lovenia Kim, MD;  Location: [facility];  Service: Gynecology;  Laterality: N/A;  Polypectomy  . WISDOM TOOTH EXTRACTION      FAMILY HISTORY Family History  Problem Relation Age of Onset  . Cancer Mother        astrocytoma; deceased 64  . Cancer Other        colon; mat grandmother's sister  . Cancer Other        esophagus; mat grandmother's sister   The patient's father is alive, age 8 as of 2014-05-13. The patient's mother died at age 36 from an astrocytoma which was diagnosed a year earlier. The patient has no brothers, one sister. There is no history of breast cancer in the the immediate family. On the maternal side however the patient's mother's grandmother lived to be 40 but had been diagnosed with breast cancer at some point. There are also cases of lung cancer, esophageal cancer, and colon cancer on the mother's side of the family. There are no other breast or ovarian cancers  to the patient's knowledge---At the 08/29/2014 visited the patient explained she has found further information regarding her family. There is a cousin who developed breast cancer in her 52s. There is another cousin, but she does not know the age, undergoing dialysis. She does not know the reason for end-stage renal disease on that person   GYNECOLOGIC HISTORY:  No LMP recorded. (Menstrual status: Irregular Periods). Menarche age 81. The patient is GX P0. She still having regular periods. She used oral contraceptives between the ages of 49 and 78. She underwent hysteroscopy with "scraping" for uterine polyps in 2010-05-13. 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 Brooke Ferguson works for a [employer] pl[employer] (they do a lot of the  testing for defibrillators.). A stepson, Brooke Ferguson, 56, graduated from [employer] and has a job and lives independently. The patient is not a church attender    ADVANCED DIRECTIVES: not in place   HEALTH MAINTENANCE: Social History   Tobacco Use  . Smoking status: Never Smoker  . Smokeless tobacco: Never Used  Substance Use Topics  . Alcohol use: Yes    Comment: SOCIALLY  . Drug use: No     Colonoscopy:never  EYC:XKGYJE 2015  Bone density:never  Lipid panel:  Allergies  Allergen Reactions  . Vicodin [Hydrocodone-Acetaminophen] Nausea And Vomiting    Current Outpatient Medications  Medication Sig Dispense Refill  . tamoxifen (NOLVADEX) 20 MG tablet TAKE 1 TABLET BY MOUTH  DAILY 90 tablet 4   No current facility-administered medications for this visit.     OBJECTIVE: young White woman in no acute distress  Vitals:   01/21/19 1038  BP: (!) 147/97  Pulse: 92  Resp: 18  Temp: 98 F (36.7 C)  SpO2: 100%     Body mass index is 27.47 kg/m.    ECOG FS:0 - Asymptomatic  Sclerae unicteric, EOMs intact Wearing a mask No cervical or supraclavicular  adenopathy Lungs no rales or rhonchi Heart regular rate and rhythm Abd soft, nontender, positive bowel sounds MSK no focal spinal tenderness, no upper extremity lymphedema Neuro: nonfocal, well oriented, appropriate affect Breasts: The right breast is status post lumpectomy and radiation with no evidence of disease recurrence.  The left breast is status post prior lumpectomy.  There are no suspicious findings.  Both axillae are benign    LAB RESULTS:  CMP     Component Value Date/Time   NA 140 01/21/2019 1009   NA 141 07/15/2016 1617   K 4.8 01/21/2019 1009   K 4.6 07/15/2016 1617   CL 107 01/21/2019 1009   CO2 22 01/21/2019 1009   CO2 23 07/15/2016 1617   GLUCOSE 123 (H) 01/21/2019 1009   GLUCOSE 102 07/15/2016 1617   BUN 9 01/21/2019 1009   BUN 14.2 07/15/2016 1617   CREATININE 0.79 01/21/2019 1009   CREATININE 0.7 07/15/2016 1617   CALCIUM 8.9 01/21/2019 1009   CALCIUM 9.5 07/15/2016 1617   PROT 6.9 01/21/2019 1009   PROT 6.5 07/15/2016 1617   ALBUMIN 3.7 01/21/2019 1009   ALBUMIN 3.6 07/15/2016 1617   AST 39 01/21/2019 1009   AST 23 07/15/2016 1617   ALT 32 01/21/2019 1009   ALT 23 07/15/2016 1617   ALKPHOS 60 01/21/2019 1009   ALKPHOS 42 07/15/2016 1617   BILITOT 0.8 01/21/2019 1009   BILITOT 0.52 07/15/2016 1617   GFRNONAA >60 01/21/2019 1009   GFRAA >60 01/21/2019 1009    INo results found for: SPEP, UPEP  Lab Results  Component Value Date   WBC 5.9 01/21/2019   NEUTROABS 3.7 01/21/2019   HGB 14.0 01/21/2019   HCT 41.1 01/21/2019   MCV 103.8 (H) 01/21/2019   PLT 261 01/21/2019      Chemistry      Component Value Date/Time   NA 140 01/21/2019 1009   NA 141 07/15/2016 1617   K 4.8 01/21/2019 1009   K 4.6 07/15/2016 1617   CL 107 01/21/2019 1009   CO2 22 01/21/2019 1009   CO2 23 07/15/2016 1617   BUN 9 01/21/2019 1009   BUN 14.2 07/15/2016 1617   CREATININE 0.79 01/21/2019 1009   CREATININE 0.7 07/15/2016 1617      Component Value Date/Time    CALCIUM 8.9 01/21/2019 1009   CALCIUM 9.5 07/15/2016 1617   ALKPHOS 60 01/21/2019 1009   ALKPHOS 42 07/15/2016 1617   AST 39 01/21/2019 1009   AST 23 07/15/2016 1617   ALT 32 01/21/2019 1009   ALT 23 07/15/2016 1617   BILITOT 0.8 01/21/2019 1009   BILITOT 0.52 07/15/2016 1617       No results found for: LABCA2  No components found for: LABCA125  No results for input(s): INR in the last 168 hours.  Urinalysis    Component  Value Date/Time   COLORURINE YELLOW 08/18/2017 0923   APPEARANCEUR CLEAR 08/18/2017 0923   LABSPEC 1.015 08/18/2017 0923   PHURINE 7.0 08/18/2017 0923   GLUCOSEU NEGATIVE 08/18/2017 0923   HGBUR NEGATIVE 08/18/2017 0923   BILIRUBINUR NEGATIVE 08/18/2017 0923   KETONESUR 15 (A) 08/18/2017 0923   PROTEINUR NEGATIVE 08/18/2017 0923   UROBILINOGEN 0.2 03/18/2011 1322   NITRITE NEGATIVE 08/18/2017 0923   LEUKOCYTESUR NEGATIVE 08/18/2017 0923    STUDIES: No results found.   ASSESSMENT: 48 y.o. BRCA negative [place] woman status post right breast upper outer quadrant biopsy 04/22/2014 for a clinical T1b N0, stage IA  Invasive ductal carcinoma, grade 1, strongly estrogen and progesterone receptor positive, HER-2 negative, with an MIB-1 of 12%   (1) genetics testing using the [employer] gene panel/ [facility] showed no deleterious mutations in ATM, BARD1, BRCA1, BRCA2, BRIP1, CDH1, CHEK2, EPCAM, MLH1, MRE11A, MSH2, MSH6, MUTYH, NBN, NF1, PALB2, PMS2, PTEN, RAD50, RAD51C, RAD51D, SMARCA4, STK11, or TP53.   (2) right partial mastectomy with sentinel lymph node sampling 05/17/2014 showed a pT1b pN0, stage IA invasive ductal carcinoma, grade 1, with negative margins.   (3) Oncotype DX score of 9 predicts a 10 year risk of outside the breast recurrence of 6% if the patient's only systemic treatment is tamoxifen for 5 years. It also predicts no benefit from chemotherapy.   (4) adjuvant radiation 06/21/2014 through 07/13/2014: Right breast 4250 cGy in 17  sessions   (5) tamoxifen started 08/29/2014  (6) left breast UOQ biopsy 10/28/2014 shows complex sclerosing lesion  (a) left lumpectomy 01/05/15 revealed fibrocystic changes with adenosis and calcifications. 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  This is very favorable.  She is tolerating tamoxifen remarkably well.  We discussed whether she would like to discontinue it in June 2021, which will be 5 years, whether she would like to continue to a total of 10 years which has been our original plan or whether she would want me to try to obtain a breast cancer in next to figure out the exact risk in the next 5 years and whether tamoxifen would be helpful or not  She understands her risk of recurrence at this point is well under 5%  On the other hand tamoxifen does help the bones.  She is not walking regularly.  She now has some plantar fasciitis which may keep her from walking even more.  She is attracted by the idea that tamoxifen, like raloxifene, can have a significant effect on bone density.    Accordingly we are continuing tamoxifen for total of 10 years.  She will of course also derive some small benefit in terms of breast cancer risk reduction by doing this  As far as her cough is concerned I asked her to start Claritin daily.  If that does not work after a few weeks she will try omeprazole 20 mg in the evening.  She knows to call for any other issues that may develop before the next visit.    Magrinat, Virgie Dad, MD  01/21/19 10:52 AM Medical Oncology and Hematology [facility] [place], [place] [address] Tel. [phone]    Fax. [phone]   I, Wilburn Mylar, am acting as scribe for Dr. Virgie Dad. Magrinat.  Lindie Spruce MD, have  reviewed the  above documentation for accuracy and completeness, and I agree with the above.

## 2019-01-21 ENCOUNTER — Inpatient Hospital Stay: Payer: Commercial Managed Care - PPO | Attending: Oncology

## 2019-01-21 ENCOUNTER — Telehealth: Payer: Self-pay | Admitting: Oncology

## 2019-01-21 ENCOUNTER — Inpatient Hospital Stay (HOSPITAL_BASED_OUTPATIENT_CLINIC_OR_DEPARTMENT_OTHER): Payer: Commercial Managed Care - PPO | Admitting: Oncology

## 2019-01-21 ENCOUNTER — Other Ambulatory Visit: Payer: Self-pay

## 2019-01-21 VITALS — BP 147/97 | HR 92 | Temp 98.0°F | Resp 18 | Ht 66.0 in | Wt 170.2 lb

## 2019-01-21 DIAGNOSIS — C50411 Malignant neoplasm of upper-outer quadrant of right female breast: Secondary | ICD-10-CM | POA: Insufficient documentation

## 2019-01-21 DIAGNOSIS — Z803 Family history of malignant neoplasm of breast: Secondary | ICD-10-CM | POA: Diagnosis not present

## 2019-01-21 DIAGNOSIS — N281 Cyst of kidney, acquired: Secondary | ICD-10-CM | POA: Insufficient documentation

## 2019-01-21 DIAGNOSIS — M1990 Unspecified osteoarthritis, unspecified site: Secondary | ICD-10-CM | POA: Diagnosis not present

## 2019-01-21 DIAGNOSIS — Z170 Estrogen receptor positive status [ER+]: Secondary | ICD-10-CM

## 2019-01-21 DIAGNOSIS — Z79810 Long term (current) use of selective estrogen receptor modulators (SERMs): Secondary | ICD-10-CM | POA: Diagnosis not present

## 2019-01-21 DIAGNOSIS — M722 Plantar fascial fibromatosis: Secondary | ICD-10-CM | POA: Insufficient documentation

## 2019-01-21 LAB — COMPREHENSIVE METABOLIC PANEL
ALT: 32 U/L (ref 0–44)
AST: 39 U/L (ref 15–41)
Albumin: 3.7 g/dL (ref 3.5–5.0)
Alkaline Phosphatase: 60 U/L (ref 38–126)
Anion gap: 11 (ref 5–15)
BUN: 9 mg/dL (ref 6–20)
CO2: 22 mmol/L (ref 22–32)
Calcium: 8.9 mg/dL (ref 8.9–10.3)
Chloride: 107 mmol/L (ref 98–111)
Creatinine, Ser: 0.79 mg/dL (ref 0.44–1.00)
GFR calc Af Amer: >60 mL/min (ref >60)
GFR calc non Af Amer: >60 mL/min (ref >60)
Glucose, Bld: 123 mg/dL — ABNORMAL HIGH (ref 70–99)
Potassium: 4.8 mmol/L (ref 3.5–5.1)
Sodium: 140 mmol/L (ref 135–145)
Total Bilirubin: 0.8 mg/dL (ref 0.3–1.2)
Total Protein: 6.9 g/dL (ref 6.5–8.1)

## 2019-01-21 LAB — CBC WITH DIFFERENTIAL/PLATELET
Abs Immature Granulocytes: 0.03 10*3/uL (ref 0.00–0.07)
Basophils Absolute: 0 10*3/uL (ref 0.0–0.1)
Basophils Relative: 1 %
Eosinophils Absolute: 0.2 10*3/uL (ref 0.0–0.5)
Eosinophils Relative: 3 %
HCT: 41.1 % (ref 36.0–46.0)
Hemoglobin: 14 g/dL (ref 12.0–15.0)
Immature Granulocytes: 1 %
Lymphocytes Relative: 24 %
Lymphs Abs: 1.4 10*3/uL (ref 0.7–4.0)
MCH: 35.4 pg — ABNORMAL HIGH (ref 26.0–34.0)
MCHC: 34.1 g/dL (ref 30.0–36.0)
MCV: 103.8 fL — ABNORMAL HIGH (ref 80.0–100.0)
Monocytes Absolute: 0.5 10*3/uL (ref 0.1–1.0)
Monocytes Relative: 9 %
Neutro Abs: 3.7 10*3/uL (ref 1.7–7.7)
Neutrophils Relative %: 62 %
Platelets: 261 10*3/uL (ref 150–400)
RBC: 3.96 MIL/uL (ref 3.87–5.11)
RDW: 11.8 % (ref 11.5–15.5)
WBC: 5.9 10*3/uL (ref 4.0–10.5)
nRBC: 0 % (ref 0.0–0.2)

## 2019-01-21 NOTE — Telephone Encounter (Signed)
I talk with patient regarding schedule  

## 2019-05-15 ENCOUNTER — Ambulatory Visit: Payer: Commercial Managed Care - PPO

## 2019-08-16 ENCOUNTER — Other Ambulatory Visit: Payer: Self-pay | Admitting: Oncology

## 2019-10-13 ENCOUNTER — Other Ambulatory Visit: Payer: Self-pay | Admitting: Oncology

## 2019-10-13 DIAGNOSIS — Z9889 Other specified postprocedural states: Secondary | ICD-10-CM

## 2019-11-23 ENCOUNTER — Ambulatory Visit
Admission: RE | Admit: 2019-11-23 | Discharge: 2019-11-23 | Disposition: A | Discharge status: Home / Self Care | Payer: Commercial Managed Care - PPO | Source: Ambulatory Visit | Attending: Oncology | Admitting: Oncology

## 2019-11-23 ENCOUNTER — Other Ambulatory Visit: Payer: Self-pay

## 2019-11-23 DIAGNOSIS — Z9889 Other specified postprocedural states: Secondary | ICD-10-CM

## 2019-12-16 ENCOUNTER — Other Ambulatory Visit: Payer: Self-pay | Admitting: Nephrology

## 2019-12-16 DIAGNOSIS — Q613 Polycystic kidney, unspecified: Secondary | ICD-10-CM

## 2019-12-20 NOTE — Progress Notes (Signed)
[place]  [phone] [phone]     ID: Brooke Ferguson DOB: 08/09/1970  MR#: [id number]  IOM#:[id number]  Patient Care Team: Lorenda Hatchet, Truman as PCP - General (Family Medicine) Erroll Luna, MD as Consulting Physician (General Surgery) Shynice Sigel, Virgie Dad, MD as Consulting Physician (Oncology) Arloa Koh, MD (Inactive) as Consulting Physician (Radiation Oncology) Rockwell Germany, RN as Registered Nurse Mauro Kaufmann, RN as Registered Nurse Brien Few, MD as Consulting Physician (Obstetrics and Gynecology) Corliss Parish, MD as Consulting Physician (Nephrology) OTHER MD: Jamal Maes MD  CHIEF COMPLAINT: early-stage estrogen receptor positive breast cancer  CURRENT TREATMENT: Completing more than 5 years of tamoxifen tamoxifen   INTERVAL HISTORY: Brooke Ferguson returns today for follow-up of her estrogen receptor positive breast cancer accompanied by her husband Brooke Ferguson.   She continues on tamoxifen.  She tolerates this remarkably well.  She does not have hot flashes.  She does not have any vaginal wetness.  Nevertheless she is considering going off this medication.  She is ready to "get out of the cancer business."  Since her last visit, she underwent bilateral diagnostic mammography with tomography at [facility] on 11/23/2019 showing: breast density category B; no evidence of malignancy in either breast.   Of note, she also underwent colonoscopy on 10/29/2019 under Dr. Rolm Bookbinder at [facility]. Pathology from the procedure ([id number]) was benign.  She tells me a repeat colonoscopy in 10 years is planned   REVIEW OF SYSTEMS: Brooke Ferguson does not have significant problems with hot flashes except at night.  She does wake up 3 or 4 times a night sometimes to urinate sometimes because of hot flashes and sometimes just because of anxiety.  She feels like she is falling off a cliff in the middle of the night.  She was given some  Vistaril for this but that did not really solve the problem.  Recall she tried Effexor remotely and did not tolerate that.  Aside from this a detailed review of systems today was stable.   BREAST CANCER HISTORY: From the original intake note:  Brooke Ferguson has received yearly mammography since age 26. She has breast density category C. On 09/30/2013 bilateral screening mammography showed a possible asymmetry in the right breast. Right diagnostic mammography with tomosynthesis and right breast ultrasonography was performed 10/08/2013. On spot compression views there appears to be a persistent mixed attenuation mass in the right breast suggesting an intramammary lymph node. Ultrasound showed several small cysts not related to this finding. There was a diagnosed in her graphic abnormality noted associated with a possible abnormality.   Accordingly 6 month follow-up was suggested and performed 04/12/2014. On this date right diagnostic mammography and ultrasonography showed an oval 5 mm mass with indistinct med margins in the upper right breast. This was not palpable. Targeted ultrasound now found a hypoechoic mass measuring 5 mm in the area in question, and this was biopsied 04/22/2014. The pathology showed (SAA [phone]) and invasive ductal carcinoma, grade 1, estrogen receptor 100% positive, progesterone receptor 97% positive, both with strong staining intensity, with an MIB-1 of 12%, and no HER-2 amplification, the signals ratio being 1.21 and the number per cell 2.00.  On 05/02/2014 the patient underwent bilateral breast MRI. This found the breast density to be category B. In the upper outer quadrant of the right breast there was a 6 mm enhancing mass with slightly irregular margins. There were no abnormal lymph nodes and the left breast was unremarkable. In the upper abdomen and  left upper quadrant there were innumerable cysts, some measuring up to 6 cm in size.  The patient's subsequent history is as  detailed below.   PAST MEDICAL HISTORY: Past Medical History:  Diagnosis Date  . Arthritis    bulging disc in upper neck C6C7  . Breast cancer of upper-outer quadrant of right female breast ([facility]) 04/26/2014  . Bulging disc    BETWEEN C6-7, HAS HAD 2 CORTISONE INJECTIONS  . History of lumpectomy of right breast 05/17/14   With sentinel node biopsy  . Personal history of radiation therapy   . S/P radiation therapy 06/21/2014 through 07/13/2014    Right breast 4250 cGy in 17 sessions     PAST SURGICAL HISTORY: Past Surgical History:  Procedure Laterality Date  . BREAST EXCISIONAL BIOPSY Left 01/2015   benign  . BREAST LUMPECTOMY Right 01/02/2015  . BREAST LUMPECTOMY WITH RADIOACTIVE SEED LOCALIZATION Left 01/05/2015   Procedure: BREAST LUMPECTOMY WITH RADIOACTIVE SEED LOCALIZATION;  Surgeon: Erroll Luna, MD;  Location: [facility];  Service: General;  Laterality: Left;  . DIAGNOSTIC LAPAROSCOPY     OVARIAN CYCTS AT AGE 24 YRS  . DILATION AND CURETTAGE OF UTERUS    . HYSTEROSCOPY WITH RESECTOSCOPE  10/25/2010   Procedure: HYSTEROSCOPY WITH RESECTOSCOPE;  Surgeon: Lovenia Kim, MD;  Location: [facility];  Service: Gynecology;  Laterality: N/A;  Polypectomy  . WISDOM TOOTH EXTRACTION      FAMILY HISTORY Family History  Problem Relation Age of Onset  . Cancer Mother        astrocytoma; deceased 71  . Cancer Other        colon; mat grandmother's sister  . Cancer Other        esophagus; mat grandmother's sister   The patient's father is alive, age 2 as of 2014-05-30. The patient's mother died at age 56 from an astrocytoma which was diagnosed a year earlier. The patient has no brothers, one sister. There is no history of breast cancer in the the immediate family. On the maternal side however the patient's mother's grandmother lived to be 44 but had been diagnosed with breast cancer at some point.  There are also cases of lung cancer, esophageal cancer, and colon cancer on the mother's side of the family. There are no other breast or ovarian cancers to the patient's knowledge---At the 08/29/2014 visited the patient explained she has found further information regarding her family. There is a cousin who developed breast cancer in her 53s. There is another cousin, but she does not know the age, undergoing dialysis. She does not know the reason for end-stage renal disease on that person   GYNECOLOGIC HISTORY:  No LMP recorded. (Menstrual status: Irregular Periods). Menarche age 44. The patient is GX P0. She still having regular periods. She used oral contraceptives between the ages of 51 and 36. She underwent hysteroscopy with "scraping" for uterine polyps in 2012. 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 A stepson, Brooke Ferguson, 56, graduated from [employer] and has a job and lives independently. The patient is not a church attender    ADVANCED DIRECTIVES: not in place   HEALTH MAINTENANCE: Social History   Tobacco Use  . Smoking status: Never Smoker  . Smokeless tobacco: Never Used  Substance Use Topics  .  Alcohol use: Yes    Comment: SOCIALLY  . Drug use: No     Colonoscopy: 10/2019 (Dr. Rolm Bookbinder, [facility]), benign  PAP: August 2015  Bone density: never    Allergies  Allergen Reactions  . Vicodin [Hydrocodone-Acetaminophen] Nausea And Vomiting    Current Outpatient Medications  Medication Sig Dispense Refill  . tamoxifen (NOLVADEX) 20 MG tablet TAKE 1 TABLET BY MOUTH  DAILY 90 tablet 3   No current facility-administered medications for this visit.    OBJECTIVE: White woman who appears younger than stated age  49:   12/21/19 0911  BP: (!) 130/97  Pulse: 88  Resp:  17  Temp: (!) 97 F (36.1 C)  SpO2: 98%     Body mass index is 26.05 kg/m.    ECOG FS:0 - Asymptomatic  Sclerae unicteric, EOMs intact Wearing a mask No cervical or supraclavicular adenopathy Lungs no rales or rhonchi Heart regular rate and rhythm Abd soft, nontender, positive bowel sounds MSK no focal spinal tenderness, no upper extremity lymphedema Neuro: nonfocal, well oriented, appropriate affect Breasts: The right breast has undergone lumpectomy followed by radiation.  There is no evidence of disease recurrence.  Left breast is benign.  Both axillae are benign.   LAB RESULTS:  CMP     Component Value Date/Time   NA 140 01/21/2019 1009   NA 141 07/15/2016 1617   K 4.8 01/21/2019 1009   K 4.6 07/15/2016 1617   CL 107 01/21/2019 1009   CO2 22 01/21/2019 1009   CO2 23 07/15/2016 1617   GLUCOSE 123 (H) 01/21/2019 1009   GLUCOSE 102 07/15/2016 1617   BUN 9 01/21/2019 1009   BUN 14.2 07/15/2016 1617   CREATININE 0.79 01/21/2019 1009   CREATININE 0.7 07/15/2016 1617   CALCIUM 8.9 01/21/2019 1009   CALCIUM 9.5 07/15/2016 1617   PROT 6.9 01/21/2019 1009   PROT 6.5 07/15/2016 1617   ALBUMIN 3.7 01/21/2019 1009   ALBUMIN 3.6 07/15/2016 1617   AST 39 01/21/2019 1009   AST 23 07/15/2016 1617   ALT 32 01/21/2019 1009   ALT 23 07/15/2016 1617   ALKPHOS 60 01/21/2019 1009   ALKPHOS 42 07/15/2016 1617   BILITOT 0.8 01/21/2019 1009   BILITOT 0.52 07/15/2016 1617   GFRNONAA >60 01/21/2019 1009   GFRAA >60 01/21/2019 1009    INo results found for: SPEP, UPEP  Lab Results  Component Value Date   WBC 4.4 12/21/2019   NEUTROABS 2.4 12/21/2019   HGB 14.1 12/21/2019   HCT 42.2 12/21/2019   MCV 102.9 (H) 12/21/2019   PLT 257 12/21/2019      Chemistry      Component Value Date/Time   NA 140 01/21/2019 1009   NA 141 07/15/2016 1617   K 4.8 01/21/2019 1009   K 4.6 07/15/2016 1617   CL 107 01/21/2019 1009   CO2 22 01/21/2019 1009   CO2 23 07/15/2016 1617   BUN 9  01/21/2019 1009   BUN 14.2 07/15/2016 1617   CREATININE 0.79 01/21/2019 1009   CREATININE 0.7 07/15/2016 1617      Component Value Date/Time   CALCIUM 8.9 01/21/2019 1009   CALCIUM 9.5 07/15/2016 1617   ALKPHOS 60 01/21/2019 1009   ALKPHOS 42 07/15/2016 1617   AST 39 01/21/2019 1009   AST 23 07/15/2016 1617   ALT 32 01/21/2019 1009   ALT 23 07/15/2016 1617   BILITOT 0.8 01/21/2019 1009   BILITOT 0.52 07/15/2016 1617  No results found for: LABCA2  No components found for: [id number]  No results for input(s): INR in the last 168 hours.  Urinalysis    Component Value Date/Time   COLORURINE YELLOW 08/18/2017 0923   APPEARANCEUR CLEAR 08/18/2017 0923   LABSPEC 1.015 08/18/2017 0923   PHURINE 7.0 08/18/2017 0923   GLUCOSEU NEGATIVE 08/18/2017 0923   HGBUR NEGATIVE 08/18/2017 0923   BILIRUBINUR NEGATIVE 08/18/2017 0923   KETONESUR 15 (A) 08/18/2017 0923   PROTEINUR NEGATIVE 08/18/2017 0923   UROBILINOGEN 0.2 03/18/2011 1322   NITRITE NEGATIVE 08/18/2017 0923   LEUKOCYTESUR NEGATIVE 08/18/2017 0923    STUDIES: MM DIAG BREAST TOMO BILATERAL  Result Date: 11/23/2019 CLINICAL DATA:  History of RIGHT breast cancer in 2016 status post breast conservation surgery. EXAM: DIGITAL DIAGNOSTIC BILATERAL MAMMOGRAM WITH TOMO AND CAD COMPARISON:  Previous exam(s). ACR Breast Density Category b: There are scattered areas of fibroglandular density. FINDINGS: There are stable postsurgical changes within the RIGHT breast. There are no new dominant masses, suspicious calcifications or secondary signs of malignancy within either breast. Mammographic images were processed with CAD. IMPRESSION: No evidence of malignancy within either breast. Stable postsurgical changes within the RIGHT breast. Per protocol, as patient has completed a 5 year course of postsurgical diagnostic mammography, patient can now return to a routine annual bilateral SCREENING mammogram schedule. RECOMMENDATION: Screening  mammogram in one year.(Code:[id number]-B-01Y) I have discussed the findings and recommendations with the patient. If applicable, a reminder letter will be sent to the patient regarding the next appointment. BI-RADS CATEGORY  2: Benign. Electronically Signed   By: Franki Cabot M.D.   On: 11/23/2019 14:12     ASSESSMENT: 49 y.o. BRCA negative [place] woman status post right breast upper outer quadrant biopsy 04/22/2014 for a clinical T1b N0, stage IA  Invasive ductal carcinoma, grade 1, strongly estrogen and progesterone receptor positive, HER-2 negative, with an MIB-1 of 12%   (1) genetics testing using the OvaNext gene panel/ [facility] showed no deleterious mutations in ATM, BARD1, BRCA1, BRCA2, BRIP1, CDH1, CHEK2, EPCAM, MLH1, MRE11A, MSH2, MSH6, MUTYH, NBN, NF1, PALB2, PMS2, PTEN, RAD50, RAD51C, RAD51D, SMARCA4, STK11, or TP53.   (2) right partial mastectomy with sentinel lymph node sampling 05/17/2014 showed a pT1b pN0, stage IA invasive ductal carcinoma, grade 1, with negative margins.   (3) Oncotype DX score of 9 predicts a 10 year risk of outside the breast recurrence of 6% if the patient's only systemic treatment is tamoxifen for 5 years. It also predicts no benefit from chemotherapy.   (4) adjuvant radiation 06/21/2014 through 07/13/2014: Right breast 4250 cGy in 17 sessions   (5) tamoxifen started 08/29/2014, discontinued October 2021  (6) left breast UOQ biopsy 10/28/2014 shows complex sclerosing lesion  (a) left lumpectomy 01/05/15 revealed fibrocystic changes with adenosis and calcifications. 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  This is very favorable.  She had thought she wanted to continue tamoxifen for a total of 10 years but at this point she would prefer to discontinue it.  I do not see  any reason for her not to stop.  The benefit of another 5 years of tamoxifen in her case would be minimal.  I do not think stopping tamoxifen however will do anything to solve her anxiety issues.  She does have treadmill  that she can use more than she is using and that will help.  Otherwise she will be discussing the anxiety problems with her primary care physician.  At this point I feel comfortable releasing her from follow-up.  All she will need in terms of breast cancer follow-up is a yearly mammogram and a yearly physician breast exam  I will be glad to see Brooke Ferguson again at any point in the future if and when the need arises but as of now are making no further routine appointments for her here  Total encounter time 25 minutes.*   Hallelujah Wysong, Virgie Dad, MD  12/21/19 9:14 AM Medical Oncology and Hematology [facility] [place], [place] [address] Tel. [phone]    Fax. [phone]   I, Wilburn Mylar, am acting as scribe for Dr. Virgie Dad. Ori Trejos.  I, Lurline Del MD, have reviewed the above documentation for accuracy and completeness, and I agree with the above.   *Total Encounter Time as defined by the Centers for Medicare and Medicaid Services includes, in addition to the face-to-face time of a patient visit (documented in the note above) non-face-to-face time: obtaining and reviewing outside history, ordering and reviewing medications, tests or procedures, care coordination (communications with other health care professionals or caregivers) and documentation in the medical record.

## 2019-12-21 ENCOUNTER — Inpatient Hospital Stay: Payer: Commercial Managed Care - PPO | Attending: Oncology | Admitting: Oncology

## 2019-12-21 ENCOUNTER — Other Ambulatory Visit: Payer: Self-pay

## 2019-12-21 ENCOUNTER — Inpatient Hospital Stay: Payer: Commercial Managed Care - PPO

## 2019-12-21 VITALS — BP 130/97 | HR 88 | Temp 97.0°F | Resp 17 | Ht 66.0 in | Wt 161.4 lb

## 2019-12-21 DIAGNOSIS — Z170 Estrogen receptor positive status [ER+]: Secondary | ICD-10-CM | POA: Diagnosis not present

## 2019-12-21 DIAGNOSIS — C50411 Malignant neoplasm of upper-outer quadrant of right female breast: Secondary | ICD-10-CM

## 2019-12-21 DIAGNOSIS — Z853 Personal history of malignant neoplasm of breast: Secondary | ICD-10-CM | POA: Insufficient documentation

## 2019-12-21 DIAGNOSIS — Z923 Personal history of irradiation: Secondary | ICD-10-CM | POA: Insufficient documentation

## 2019-12-21 DIAGNOSIS — F419 Anxiety disorder, unspecified: Secondary | ICD-10-CM | POA: Diagnosis not present

## 2019-12-21 DIAGNOSIS — N281 Cyst of kidney, acquired: Secondary | ICD-10-CM | POA: Diagnosis not present

## 2019-12-21 LAB — CBC WITH DIFFERENTIAL (CANCER CENTER ONLY)
Abs Immature Granulocytes: 0.02 10*3/uL (ref 0.00–0.07)
Basophils Absolute: 0 10*3/uL (ref 0.0–0.1)
Basophils Relative: 1 %
Eosinophils Absolute: 0.1 10*3/uL (ref 0.0–0.5)
Eosinophils Relative: 1 %
HCT: 42.2 % (ref 36.0–46.0)
Hemoglobin: 14.1 g/dL (ref 12.0–15.0)
Immature Granulocytes: 1 %
Lymphocytes Relative: 35 %
Lymphs Abs: 1.5 10*3/uL (ref 0.7–4.0)
MCH: 34.4 pg — ABNORMAL HIGH (ref 26.0–34.0)
MCHC: 33.4 g/dL (ref 30.0–36.0)
MCV: 102.9 fL — ABNORMAL HIGH (ref 80.0–100.0)
Monocytes Absolute: 0.4 10*3/uL (ref 0.1–1.0)
Monocytes Relative: 9 %
Neutro Abs: 2.4 10*3/uL (ref 1.7–7.7)
Neutrophils Relative %: 53 %
Platelet Count: 257 10*3/uL (ref 150–400)
RBC: 4.1 MIL/uL (ref 3.87–5.11)
RDW: 12.6 % (ref 11.5–15.5)
WBC Count: 4.4 10*3/uL (ref 4.0–10.5)
nRBC: 0 % (ref 0.0–0.2)

## 2019-12-21 LAB — CMP (CANCER CENTER ONLY)
ALT: 14 U/L (ref 0–44)
AST: 22 U/L (ref 15–41)
Albumin: 3.6 g/dL (ref 3.5–5.0)
Alkaline Phosphatase: 54 U/L (ref 38–126)
Anion gap: 7 (ref 5–15)
BUN: 8 mg/dL (ref 6–20)
CO2: 28 mmol/L (ref 22–32)
Calcium: 9.6 mg/dL (ref 8.9–10.3)
Chloride: 104 mmol/L (ref 98–111)
Creatinine: 0.82 mg/dL (ref 0.44–1.00)
GFR, Estimated: >60 mL/min (ref >60)
Glucose, Bld: 112 mg/dL — ABNORMAL HIGH (ref 70–99)
Potassium: 4.4 mmol/L (ref 3.5–5.1)
Sodium: 139 mmol/L (ref 135–145)
Total Bilirubin: 0.9 mg/dL (ref 0.3–1.2)
Total Protein: 7 g/dL (ref 6.5–8.1)

## 2019-12-22 ENCOUNTER — Ambulatory Visit
Admission: RE | Admit: 2019-12-22 | Discharge: 2019-12-22 | Disposition: A | Discharge status: Home / Self Care | Payer: Commercial Managed Care - PPO | Source: Ambulatory Visit | Attending: Nephrology | Admitting: Nephrology

## 2019-12-22 ENCOUNTER — Telehealth: Payer: Self-pay | Admitting: Oncology

## 2019-12-22 DIAGNOSIS — Q613 Polycystic kidney, unspecified: Secondary | ICD-10-CM

## 2019-12-22 NOTE — Telephone Encounter (Signed)
No 10/19 los, no changes made to pt schedule  °

## 2020-10-12 ENCOUNTER — Other Ambulatory Visit: Payer: Self-pay | Admitting: Obstetrics and Gynecology

## 2020-10-12 DIAGNOSIS — Z1231 Encounter for screening mammogram for malignant neoplasm of breast: Secondary | ICD-10-CM

## 2020-11-30 ENCOUNTER — Ambulatory Visit
Admission: RE | Admit: 2020-11-30 | Discharge: 2020-11-30 | Disposition: A | Discharge status: Home / Self Care | Payer: Commercial Managed Care - PPO | Source: Ambulatory Visit | Attending: Obstetrics and Gynecology | Admitting: Obstetrics and Gynecology

## 2020-11-30 ENCOUNTER — Other Ambulatory Visit: Payer: Self-pay

## 2020-11-30 DIAGNOSIS — Z1231 Encounter for screening mammogram for malignant neoplasm of breast: Secondary | ICD-10-CM

## 2020-11-30 HISTORY — DX: Malignant neoplasm of unspecified site of unspecified female breast: C50.919

## 2020-12-07 ENCOUNTER — Other Ambulatory Visit: Payer: Self-pay | Admitting: Obstetrics and Gynecology

## 2020-12-07 DIAGNOSIS — R928 Other abnormal and inconclusive findings on diagnostic imaging of breast: Secondary | ICD-10-CM

## 2020-12-12 ENCOUNTER — Other Ambulatory Visit: Payer: Self-pay

## 2020-12-12 ENCOUNTER — Ambulatory Visit
Admission: RE | Admit: 2020-12-12 | Discharge: 2020-12-12 | Disposition: A | Discharge status: Home / Self Care | Payer: Commercial Managed Care - PPO | Source: Ambulatory Visit | Attending: Obstetrics and Gynecology | Admitting: Obstetrics and Gynecology

## 2020-12-12 ENCOUNTER — Other Ambulatory Visit: Payer: Self-pay | Admitting: Obstetrics and Gynecology

## 2020-12-12 DIAGNOSIS — R928 Other abnormal and inconclusive findings on diagnostic imaging of breast: Secondary | ICD-10-CM

## 2020-12-15 ENCOUNTER — Ambulatory Visit
Admission: RE | Admit: 2020-12-15 | Discharge: 2020-12-15 | Disposition: A | Discharge status: Home / Self Care | Payer: Commercial Managed Care - PPO | Source: Ambulatory Visit | Attending: Obstetrics and Gynecology | Admitting: Obstetrics and Gynecology

## 2020-12-15 ENCOUNTER — Other Ambulatory Visit: Payer: Self-pay | Admitting: Obstetrics and Gynecology

## 2020-12-15 ENCOUNTER — Other Ambulatory Visit: Payer: Self-pay

## 2020-12-15 DIAGNOSIS — R928 Other abnormal and inconclusive findings on diagnostic imaging of breast: Secondary | ICD-10-CM

## 2020-12-22 ENCOUNTER — Other Ambulatory Visit: Payer: Commercial Managed Care - PPO

## 2022-01-16 ENCOUNTER — Encounter: Payer: Self-pay | Admitting: Obstetrics and Gynecology

## 2022-01-16 DIAGNOSIS — Z1231 Encounter for screening mammogram for malignant neoplasm of breast: Secondary | ICD-10-CM

## 2022-04-02 ENCOUNTER — Other Ambulatory Visit: Payer: Self-pay | Admitting: Obstetrics and Gynecology

## 2022-04-02 DIAGNOSIS — Z1231 Encounter for screening mammogram for malignant neoplasm of breast: Secondary | ICD-10-CM

## 2022-05-22 ENCOUNTER — Ambulatory Visit
Admission: RE | Admit: 2022-05-22 | Discharge: 2022-05-22 | Disposition: A | Discharge status: Home / Self Care | Payer: PRIVATE HEALTH INSURANCE | Source: Ambulatory Visit | Attending: Obstetrics and Gynecology | Admitting: Obstetrics and Gynecology

## 2022-05-22 DIAGNOSIS — Z1231 Encounter for screening mammogram for malignant neoplasm of breast: Secondary | ICD-10-CM

## 2022-05-24 ENCOUNTER — Other Ambulatory Visit: Payer: Self-pay | Admitting: Obstetrics and Gynecology

## 2022-05-24 DIAGNOSIS — R928 Other abnormal and inconclusive findings on diagnostic imaging of breast: Secondary | ICD-10-CM

## 2022-06-06 ENCOUNTER — Ambulatory Visit
Admission: RE | Admit: 2022-06-06 | Discharge: 2022-06-06 | Disposition: A | Discharge status: Home / Self Care | Payer: PRIVATE HEALTH INSURANCE | Source: Ambulatory Visit | Attending: Obstetrics and Gynecology | Admitting: Obstetrics and Gynecology

## 2022-06-06 ENCOUNTER — Ambulatory Visit: Payer: PRIVATE HEALTH INSURANCE

## 2022-06-06 DIAGNOSIS — R928 Other abnormal and inconclusive findings on diagnostic imaging of breast: Secondary | ICD-10-CM

## 2023-06-23 ENCOUNTER — Other Ambulatory Visit: Payer: Self-pay | Admitting: Obstetrics and Gynecology

## 2023-06-23 DIAGNOSIS — Z1231 Encounter for screening mammogram for malignant neoplasm of breast: Secondary | ICD-10-CM

## 2023-07-09 ENCOUNTER — Ambulatory Visit
Admission: RE | Admit: 2023-07-09 | Discharge: 2023-07-09 | Disposition: A | Discharge status: Home / Self Care | Payer: PRIVATE HEALTH INSURANCE | Source: Ambulatory Visit | Attending: Obstetrics and Gynecology | Admitting: Obstetrics and Gynecology

## 2023-07-09 DIAGNOSIS — Z1231 Encounter for screening mammogram for malignant neoplasm of breast: Secondary | ICD-10-CM
